# Patient Record
Sex: Male | Born: 1965 | Race: White | Hispanic: No | Marital: Married | Smoking: Never smoker
Health system: Southern US, Community
[De-identification: ages and names within clinical notes are randomized; demographics above are authoritative.]

## PROBLEM LIST (undated history)

## (undated) DIAGNOSIS — I10 Essential (primary) hypertension: Secondary | ICD-10-CM

## (undated) DIAGNOSIS — G8929 Other chronic pain: Secondary | ICD-10-CM

## (undated) DIAGNOSIS — J9383 Other pneumothorax: Secondary | ICD-10-CM

## (undated) DIAGNOSIS — M549 Dorsalgia, unspecified: Secondary | ICD-10-CM

## (undated) HISTORY — DX: Other pneumothorax: J93.83

## (undated) HISTORY — PX: WISDOM TOOTH EXTRACTION: SHX21

---

## 2007-04-24 ENCOUNTER — Ambulatory Visit: Payer: Self-pay | Admitting: Gastroenterology

## 2007-05-16 ENCOUNTER — Ambulatory Visit: Payer: Self-pay | Admitting: Gastroenterology

## 2007-06-11 DIAGNOSIS — K219 Gastro-esophageal reflux disease without esophagitis: Secondary | ICD-10-CM

## 2007-06-11 DIAGNOSIS — I10 Essential (primary) hypertension: Secondary | ICD-10-CM | POA: Insufficient documentation

## 2007-06-11 DIAGNOSIS — K644 Residual hemorrhoidal skin tags: Secondary | ICD-10-CM | POA: Insufficient documentation

## 2007-07-15 ENCOUNTER — Ambulatory Visit: Payer: Self-pay | Admitting: Gastroenterology

## 2007-07-15 LAB — CONVERTED CEMR LAB
Fecal Occult Blood: NEGATIVE
OCCULT 2: NEGATIVE
OCCULT 2: NEGATIVE
OCCULT 3: NEGATIVE
OCCULT 4: NEGATIVE
OCCULT 5: NEGATIVE

## 2009-08-19 ENCOUNTER — Encounter: Admission: RE | Admit: 2009-08-19 | Discharge: 2009-08-19 | Payer: Self-pay

## 2010-10-24 NOTE — Assessment & Plan Note (Signed)
[facility]                         GASTROENTEROLOGY OFFICE NOTE   NAME:Clermont, JOMO FORAND                        MRN:          [id number]  DATE:04/24/2007                            DOB:          09-24-1965    GASTROENTEROLOGY CONSULTATION   REASON FOR VISIT:  Painful hemorrhoids.  The patient is self referred.   HISTORY OF PRESENT ILLNESS:  Mr. Minturn is a very pleasant 45 year old  man who has had external hemorrhoids intermittently over the past three  to four years.  He had a pretty bad one about 2-1/2 weeks ago, got on  Sitz baths, Preparation H, was taking Colace and after about a week the  hemorrhoid resolved and then two days ago a new one came back.  He was  lifting some weights recently that may have contributed.  He has had no  bleeding.  He does intermittently have to strain to move his bowels.  He  describes himself as fairly irregularly irregular with his bowel habits.  Currently he is in quite a bit of discomfort, walks gingerly around the  room, sits gently on his bottom.   REVIEW OF SYSTEMS:  Essentially negative and is available on his nursing  intake sheet.   PAST MEDICAL HISTORY:  1. Hypertension.  2. Chronic GERD symptoms.   CURRENT MEDICATIONS:  Prilosec.   ALLERGIES:  No known drug allergies.   SOCIAL HISTORY:  Married with no children.  Currently not working.  I  believe he was recently laid off.  He is a nonsmoker, nondrinker.   FAMILY HISTORY:  Father with bladder cancer.  Mother and sister with  breast cancer.  Grandmother with uterine cancer.  Great aunt with colon  cancer.  Mother and grandmother with colon polyps.   PHYSICAL EXAMINATION:  VITAL SIGNS:  5 feet, 10 inches, 206 pounds.  Blood pressure 134/94. Pulse 82.  CONSTITUTIONAL:  Generally well-appearing.  NEUROLOGICAL:  Alert and oriented x3.  HEENT:  Eyes:  Extraocular movements intact. Mouth, oropharynx moist, no  lesions.  NECK:  Supple.  No  lymphadenopathy.  CARDIOVASCULAR:  Heart has regular rate and rhythm.  LUNGS:  Clear to auscultation bilaterally.  ABDOMEN:  Soft, nontender, nondistended.  Normal bowel sounds.  EXTREMITIES:  No lower extremity edema.  SKIN:  No rashes or lesions on visible extremities.  RECTAL:  Anorectal examination medium sized external hemorrhoid, swollen  and definitely tender.  There was no fluctuance or perianal erythema or  tenderness to suggest a perirectal abscess.  Cannot do a full rectal  examination due to pain but I did not feel any obvious distal rectal  masses.  No anal fissures.   ASSESSMENT:  This is a 45 year old man with painful external  hemorrhoids.   PLAN:  I have given him samples of several tubes of Analpram and  instructed him to apply this twice a day after he does Sitz baths.  I  have also recommended he begin taking Citrucel on a daily basis to try  to bulk up his stools and hopefully this will lead him to not have to  strain at  all.  He will return to see me in four to five weeks.  He does  have a family history of colon polyps and colon cancer that also runs in  his family and I think we should proceed with colonoscopy but I would  like to get this external hemorrhoid under control first.  If this does  not improve with conservative management I would refer him over to  general surgery.     Rachael Fee, MD  Electronically Signed    DPJ/MedQ  DD: 04/24/2007  DT: 04/25/2007  Job #: [id number]

## 2010-10-24 NOTE — Assessment & Plan Note (Signed)
[facility]                         GASTROENTEROLOGY OFFICE NOTE   NAME:Nunn, DRACO MALCZEWSKI                        MRN:          [id number]  DATE:05/16/2007                            DOB:          [birth year]    GI PROBLEM LIST:  External hemorrhoids.  Painful external hemorrhoids  that resolved with sitz baths, Analpram, Citrucel.   INTERVAL HISTORY:  I last saw Burgess about 1 month ago.  Since then, the  painful hemorrhoid has definitely resolved.  He said it took about a  week or 2 after seeing him in the office.  He has started to wean  himself off of the Citrucel.  He also brings up some chronic GERD  symptoms.  He has heartburn on an every 3 to 4 day basis.  This goes  back to when he was a teenager.  He has no dysphagia.  No vomiting.  He  takes Prilosec 3 to 4 times a week.   CURRENT MEDICATIONS:  Prilosec over-the-counter p.r.n.   ALLERGIES:  No known drug allergies.   PHYSICAL EXAM:  Weight 207 pounds, blood pressure 128/88, pulse 80.  CONSTITUTIONAL:  Generally well-appearing.  ABDOMEN:  Soft, nontender, nondistended.  Normal bowel sounds.  RECTAL:  Single small external hemorrhoid, nearly flattened out.  Much,  much improved since the exam 1 month ago.   ASSESSMENT AND PLAN:  A 45 year old male with chronic gastroesophageal  reflux disease symptoms and external hemorrhoids.   His GERD symptoms go back to when he was a teenager, and I think we  should arrange for him to have an EGD performed at his soonest  convenience to screen him for chronic complications, such as GERD.  He  does not have dysphagia and so I doubt he has any stricturing disease.  He has a significant family history of cancer, breast cancer, uterine  cancer, bladder cancer, in first degree relatives.  He is understandably  concerned.  I will send him home with some stool cards, and if any of  those are positive, then we will proceed with colonoscopy at the same  time as  his upper endoscopy.  I also recommended that he get back on the  Citrucel, as that is probably the best way to prevent him from having  recurrence of painful hemorrhoids again.     Rachael Fee, MD  Electronically Signed    DPJ/MedQ  DD: 05/16/2007  DT: 05/16/2007  Job #: [id number]

## 2011-04-18 ENCOUNTER — Emergency Department (INDEPENDENT_AMBULATORY_CARE_PROVIDER_SITE_OTHER): Payer: 59

## 2011-04-18 ENCOUNTER — Emergency Department (HOSPITAL_BASED_OUTPATIENT_CLINIC_OR_DEPARTMENT_OTHER)
Admission: EM | Admit: 2011-04-18 | Discharge: 2011-04-18 | Disposition: A | Discharge status: Home / Self Care | Payer: 59 | Attending: Emergency Medicine | Admitting: Emergency Medicine

## 2011-04-18 DIAGNOSIS — M549 Dorsalgia, unspecified: Secondary | ICD-10-CM

## 2011-04-18 DIAGNOSIS — S335XXA Sprain of ligaments of lumbar spine, initial encounter: Secondary | ICD-10-CM | POA: Insufficient documentation

## 2011-04-18 DIAGNOSIS — W19XXXA Unspecified fall, initial encounter: Secondary | ICD-10-CM | POA: Insufficient documentation

## 2011-04-18 DIAGNOSIS — G8929 Other chronic pain: Secondary | ICD-10-CM | POA: Insufficient documentation

## 2011-04-18 DIAGNOSIS — S39012A Strain of muscle, fascia and tendon of lower back, initial encounter: Secondary | ICD-10-CM

## 2011-04-18 HISTORY — DX: Dorsalgia, unspecified: M54.9

## 2011-04-18 HISTORY — DX: Essential (primary) hypertension: I10

## 2011-04-18 HISTORY — DX: Other chronic pain: G89.29

## 2011-04-18 MED ORDER — CYCLOBENZAPRINE HCL 10 MG PO TABS: 10.0000 mg | ORAL_TABLET | Freq: Two times a day (BID) | ORAL | Status: DC | PRN

## 2011-04-18 MED ORDER — KETOROLAC TROMETHAMINE 30 MG/ML IJ SOLN
30.0000 mg | Freq: Once | INTRAMUSCULAR | Status: AC
  Administered 2011-04-18: 30 mg via INTRAVENOUS
  Filled 2011-04-18: qty 1

## 2011-04-18 MED ORDER — OXYCODONE-ACETAMINOPHEN 5-325 MG PO TABS: 2.0000 | ORAL_TABLET | ORAL | Status: DC | PRN

## 2011-04-18 MED ORDER — OXYCODONE-ACETAMINOPHEN 5-325 MG PO TABS
1.0000 | ORAL_TABLET | Freq: Once | ORAL | Status: AC
  Administered 2011-04-18: 1 via ORAL
  Filled 2011-04-18: qty 1

## 2011-04-18 NOTE — ED Notes (Signed)
Chronic back pain-worse last night-was seen by chiropractor with adjustment PTA-pain worse after adjustment

## 2011-04-18 NOTE — ED Provider Notes (Signed)
History     CSN: [id number] Arrival date & time: 04/18/2011  5:09 PM   First MD Initiated Contact with Patient 04/18/11 1710      Chief Complaint  Patient presents with  . Back Pain   Patient does have a history of chronic back pain.  He states the pain is worse over the past few days. Patient states that he has the pain "every 4 years.". Patient saw a chiropractor today and then referred him to a family practice doctor. He was not actually seen by the family practice doctor, but was sent here for further care. Patient is requesting an x-ray, to "rule out any fracture." Patient was told his pain was likely secondary to degenerative disc disease or bulging disc. He's had no numbness, weakness or tingling. No urinary incontinence, or saddle anesthesia. He did fall approximately 1 month ago. He states he normally takes anti-inflammatories for pain. The pain is diffusely along the lower back. Worse with certain positions and movement. No fever or dysuria (Consider location/radiation/quality/duration/timing/severity/associated sxs/prior treatment) HPI  Past Medical History  Diagnosis Date  . Back pain, chronic   . Hypertension     History reviewed. No pertinent past surgical history.  No family history on file.  History  Substance Use Topics  . Smoking status: Never Smoker   . Smokeless tobacco: Not on file  . Alcohol Use: No      Review of Systems  All other systems reviewed and are negative.    Allergies  Review of patient's allergies indicates no known allergies.  Home Medications  No current outpatient prescriptions on file.  BP 125/99  Pulse 86  Temp(Src) 98.2 F (36.8 C) (Oral)  Resp 16  Ht 5\' 10"  (1.778 m)  Wt 205 lb (92.987 kg)  BMI 29.41 kg/m2  SpO2 100%  Physical Exam  Constitutional: He is oriented to person, place, and time. He appears well-developed and well-nourished.  HENT:  Head: Normocephalic and atraumatic.  Eyes: Conjunctivae and EOM are  normal. Pupils are equal, round, and reactive to light.  Neck: Neck supple.  Cardiovascular: Normal rate and regular rhythm.  Exam reveals no gallop and no friction rub.   No murmur heard. Pulmonary/Chest: Breath sounds normal. He has no wheezes. He has no rales. He exhibits no tenderness.  Abdominal: Soft. Bowel sounds are normal. He exhibits no distension. There is no tenderness. There is no rebound and no guarding.  Musculoskeletal: Normal range of motion.       Diffuse lumbar muscular tenderness. Minimal lumbar spine tenderness. No swelling  Neurological: He is alert and oriented to person, place, and time. He has normal reflexes. No cranial nerve deficit. Coordination normal.  Skin: Skin is warm and dry. No rash noted.  Psychiatric: He has a normal mood and affect.    ED Course  Procedures (including critical care time)  Labs Reviewed - No data to display No results found.   No diagnosis found.    MDM  Patient is seen and examined, initial history and physical is completed. Evaluation initiated        Loron Weimer A. Patrica Duel, MD 04/18/11 1724

## 2011-04-19 ENCOUNTER — Ambulatory Visit: Payer: 59 | Admitting: Family Medicine

## 2011-04-20 ENCOUNTER — Ambulatory Visit (INDEPENDENT_AMBULATORY_CARE_PROVIDER_SITE_OTHER): Payer: 59 | Admitting: Family Medicine

## 2011-04-20 ENCOUNTER — Encounter: Payer: Self-pay | Admitting: Family Medicine

## 2011-04-20 VITALS — BP 145/98 | HR 88 | Temp 98.5°F | Ht 70.0 in | Wt 205.0 lb

## 2011-04-20 DIAGNOSIS — M545 Low back pain, unspecified: Secondary | ICD-10-CM | POA: Insufficient documentation

## 2011-04-20 DIAGNOSIS — M549 Dorsalgia, unspecified: Secondary | ICD-10-CM

## 2011-04-20 MED ORDER — CYCLOBENZAPRINE HCL 10 MG PO TABS: 10.0000 mg | ORAL_TABLET | Freq: Three times a day (TID) | ORAL | Status: AC | PRN

## 2011-04-20 MED ORDER — OXYCODONE-ACETAMINOPHEN 5-325 MG PO TABS: 1.0000 | ORAL_TABLET | Freq: Four times a day (QID) | ORAL | Status: AC | PRN

## 2011-04-20 NOTE — Assessment & Plan Note (Signed)
Acute on chronic low back pain - no evidence lumbar radiculopathy.  Has never tried PT for this - will start now.  He is also interested in seeing a chiropractor for this.  Start aleve 2 tabs twice a day with percocet and flexeril as needed.  Heat or ice as needed.  Is considering a lumbar support.  F/u in 1 month for reevaluation.
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
## 2011-04-20 NOTE — Patient Instructions (Signed)
This is likely a lumbar strain and spasms vs a bulging disc in your back. Both are treated the same initially so it's not urgent to get an MRI to assess this unless you're not getting better as expected over the next month. Aleve 2 tablets twice a day with food (or ibuprofen 3 tablets three times a day with food) for pain and inflammation (if you do not have stomach or kidney issues). Percocet as needed for severe pain (no driving on this medicine). Flexeril as needed for muscle spasms (no driving on this medicine if it makes you sleepy). Stay as active as possible. Buy back/lumbar support if this helps you with pain but don't become solely reliant on this. Start physical therapy for up to 6 weeks though you can transition to a home program sooner than this. Do home exercises and stretches as directed - hold each for 20-30 seconds and do each one three times. Consider massage, chiropractor, physical therapy, and/or acupuncture. Physical therapy has been shown to be helpful while the others have mixed results. Thereasa Distance is the chiropractor I typically recommend [phone]) - in [place] on [address]. Strengthening of low back muscles, abdominal musculature are key for long term pain relief. If not improving, will consider further imaging (MRI). Follow up with me in 1 month for a recheck on how you're doing.

## 2011-04-23 ENCOUNTER — Ambulatory Visit: Payer: 59 | Attending: Family Medicine | Admitting: Physical Therapy

## 2011-04-23 DIAGNOSIS — IMO0001 Reserved for inherently not codable concepts without codable children: Secondary | ICD-10-CM | POA: Insufficient documentation

## 2011-04-23 DIAGNOSIS — M545 Low back pain, unspecified: Secondary | ICD-10-CM | POA: Insufficient documentation

## 2011-04-30 ENCOUNTER — Ambulatory Visit: Payer: 59 | Admitting: Physical Therapy

## 2011-05-07 ENCOUNTER — Ambulatory Visit: Payer: 59 | Admitting: Physical Therapy

## 2011-05-14 ENCOUNTER — Ambulatory Visit: Payer: 59 | Attending: Family Medicine | Admitting: Physical Therapy

## 2011-05-14 DIAGNOSIS — IMO0001 Reserved for inherently not codable concepts without codable children: Secondary | ICD-10-CM | POA: Insufficient documentation

## 2011-05-14 DIAGNOSIS — M545 Low back pain, unspecified: Secondary | ICD-10-CM | POA: Insufficient documentation

## 2011-05-21 ENCOUNTER — Ambulatory Visit: Payer: 59 | Admitting: Physical Therapy

## 2011-05-21 ENCOUNTER — Ambulatory Visit: Payer: 59 | Admitting: Family Medicine

## 2011-05-28 ENCOUNTER — Ambulatory Visit: Payer: 59 | Admitting: Physical Therapy

## 2011-05-28 ENCOUNTER — Encounter: Payer: Self-pay | Admitting: Family Medicine

## 2011-05-28 ENCOUNTER — Ambulatory Visit (INDEPENDENT_AMBULATORY_CARE_PROVIDER_SITE_OTHER): Payer: 59 | Admitting: Family Medicine

## 2011-05-28 VITALS — BP 138/93 | HR 89 | Temp 98.3°F | Ht 70.0 in | Wt 205.0 lb

## 2011-05-28 DIAGNOSIS — M545 Low back pain: Secondary | ICD-10-CM

## 2011-05-28 NOTE — Assessment & Plan Note (Signed)
Acute on chronic low back pain - no evidence lumbar radiculopathy.  Did well with PT until Saturday when did a lot of bending and lifting with christmas decorations.  Encouraged to continue with therapy and current medicines.  Again given number for chiropractor as he wants to try this also.  Heat/ice as needed.  Has lumbar support at home.  Using a TENS unit as well.  F/u in 5-6 weeks for reevaluation.

## 2011-05-28 NOTE — Patient Instructions (Signed)
Aleve 2 tablets twice a day with food (or ibuprofen 3 tablets three times a day with food) for pain and inflammation (if you do not have stomach or kidney issues). Percocet as needed for severe pain (no driving on this medicine). Flexeril as needed for muscle spasms (no driving on this medicine if it makes you sleepy). Stay as active as possible. If you can't find your lumbar support, let us know and we can fit you with one here - use it when you'll be doing a lot of lifting, crawling, bending. Continue with physical therapy and home stretches/exercises. Consider massage, chiropractor, physical therapy, and/or acupuncture. Physical therapy has been shown to be helpful while the others have mixed results. Thereasa Distance is the chiropractor I typically recommend [phone]) - in [place] on [address]. Strengthening of low back muscles, abdominal musculature are key for long term pain relief. Follow up with me in 5-6 weeks for a recheck. Prednisone is a consideration but has a lot of side effects so we do not use this a lot and is more helpful in those with an obvious pinched nerve in their back.
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
## 2011-05-30 ENCOUNTER — Ambulatory Visit: Payer: 59 | Admitting: Physical Therapy

## 2011-05-30 ENCOUNTER — Ambulatory Visit: Payer: 59 | Admitting: Family Medicine

## 2011-06-01 NOTE — Progress Notes (Signed)
Addended by: Lenda Kelp on: 06/01/2011 03:33 PM   Modules accepted: Orders

## 2011-06-02 ENCOUNTER — Ambulatory Visit (HOSPITAL_BASED_OUTPATIENT_CLINIC_OR_DEPARTMENT_OTHER): Admission: RE | Admit: 2011-06-02 | Payer: 59 | Source: Ambulatory Visit

## 2011-06-02 ENCOUNTER — Ambulatory Visit (HOSPITAL_BASED_OUTPATIENT_CLINIC_OR_DEPARTMENT_OTHER)
Admission: RE | Admit: 2011-06-02 | Discharge: 2011-06-02 | Disposition: A | Discharge status: Home / Self Care | Payer: 59 | Source: Ambulatory Visit | Attending: Family Medicine | Admitting: Family Medicine

## 2011-06-02 DIAGNOSIS — M545 Low back pain, unspecified: Secondary | ICD-10-CM

## 2011-06-09 ENCOUNTER — Ambulatory Visit (HOSPITAL_BASED_OUTPATIENT_CLINIC_OR_DEPARTMENT_OTHER)
Admission: RE | Admit: 2011-06-09 | Discharge: 2011-06-09 | Disposition: A | Discharge status: Home / Self Care | Payer: 59 | Source: Ambulatory Visit | Attending: Family Medicine | Admitting: Family Medicine

## 2011-06-09 DIAGNOSIS — M5126 Other intervertebral disc displacement, lumbar region: Secondary | ICD-10-CM | POA: Insufficient documentation

## 2011-06-09 DIAGNOSIS — M519 Unspecified thoracic, thoracolumbar and lumbosacral intervertebral disc disorder: Secondary | ICD-10-CM | POA: Insufficient documentation

## 2011-06-09 DIAGNOSIS — M545 Low back pain, unspecified: Secondary | ICD-10-CM | POA: Insufficient documentation

## 2011-06-09 DIAGNOSIS — M5137 Other intervertebral disc degeneration, lumbosacral region: Secondary | ICD-10-CM

## 2011-06-09 DIAGNOSIS — M79609 Pain in unspecified limb: Secondary | ICD-10-CM

## 2011-06-09 DIAGNOSIS — M6281 Muscle weakness (generalized): Secondary | ICD-10-CM | POA: Insufficient documentation

## 2011-06-09 DIAGNOSIS — R5381 Other malaise: Secondary | ICD-10-CM

## 2011-06-20 ENCOUNTER — Ambulatory Visit: Payer: 59 | Attending: Family Medicine | Admitting: Physical Therapy

## 2011-06-20 DIAGNOSIS — M545 Low back pain, unspecified: Secondary | ICD-10-CM | POA: Insufficient documentation

## 2011-06-20 DIAGNOSIS — IMO0001 Reserved for inherently not codable concepts without codable children: Secondary | ICD-10-CM | POA: Insufficient documentation

## 2011-06-27 ENCOUNTER — Ambulatory Visit: Payer: 59 | Admitting: Physical Therapy

## 2011-07-04 ENCOUNTER — Ambulatory Visit: Payer: 59 | Admitting: Physical Therapy

## 2011-07-11 ENCOUNTER — Ambulatory Visit: Payer: 59 | Admitting: Physical Therapy

## 2011-07-18 ENCOUNTER — Ambulatory Visit: Payer: 59 | Attending: Internal Medicine | Admitting: Physical Therapy

## 2011-07-18 DIAGNOSIS — IMO0001 Reserved for inherently not codable concepts without codable children: Secondary | ICD-10-CM | POA: Insufficient documentation

## 2011-07-18 DIAGNOSIS — M545 Low back pain, unspecified: Secondary | ICD-10-CM | POA: Insufficient documentation

## 2011-07-25 ENCOUNTER — Ambulatory Visit: Payer: 59 | Admitting: Physical Therapy

## 2011-09-07 ENCOUNTER — Encounter: Payer: Self-pay | Admitting: *Deleted

## 2012-01-15 IMAGING — CR DG LUMBAR SPINE 2-3V
3 series · 3 of 3 positions shown · non-contrast
Comparison: 04/18/2011

CLINICAL DATA: Low back pain.

LUMBAR SPINE - 2-3 VIEW

[t l-spine a.p.]
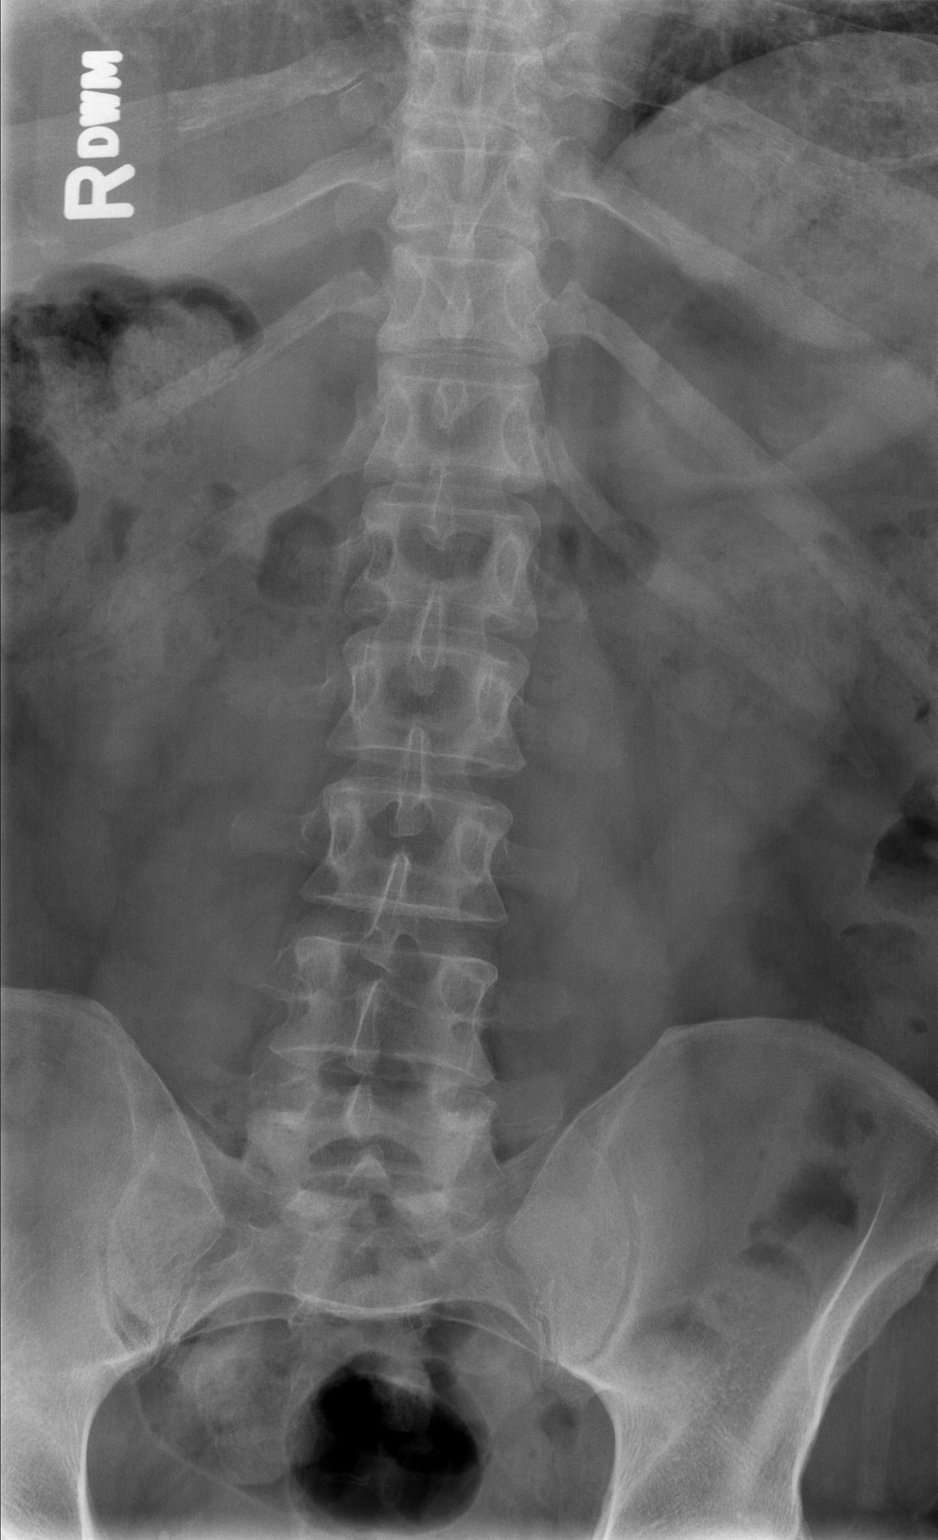

[t l-spine lat]
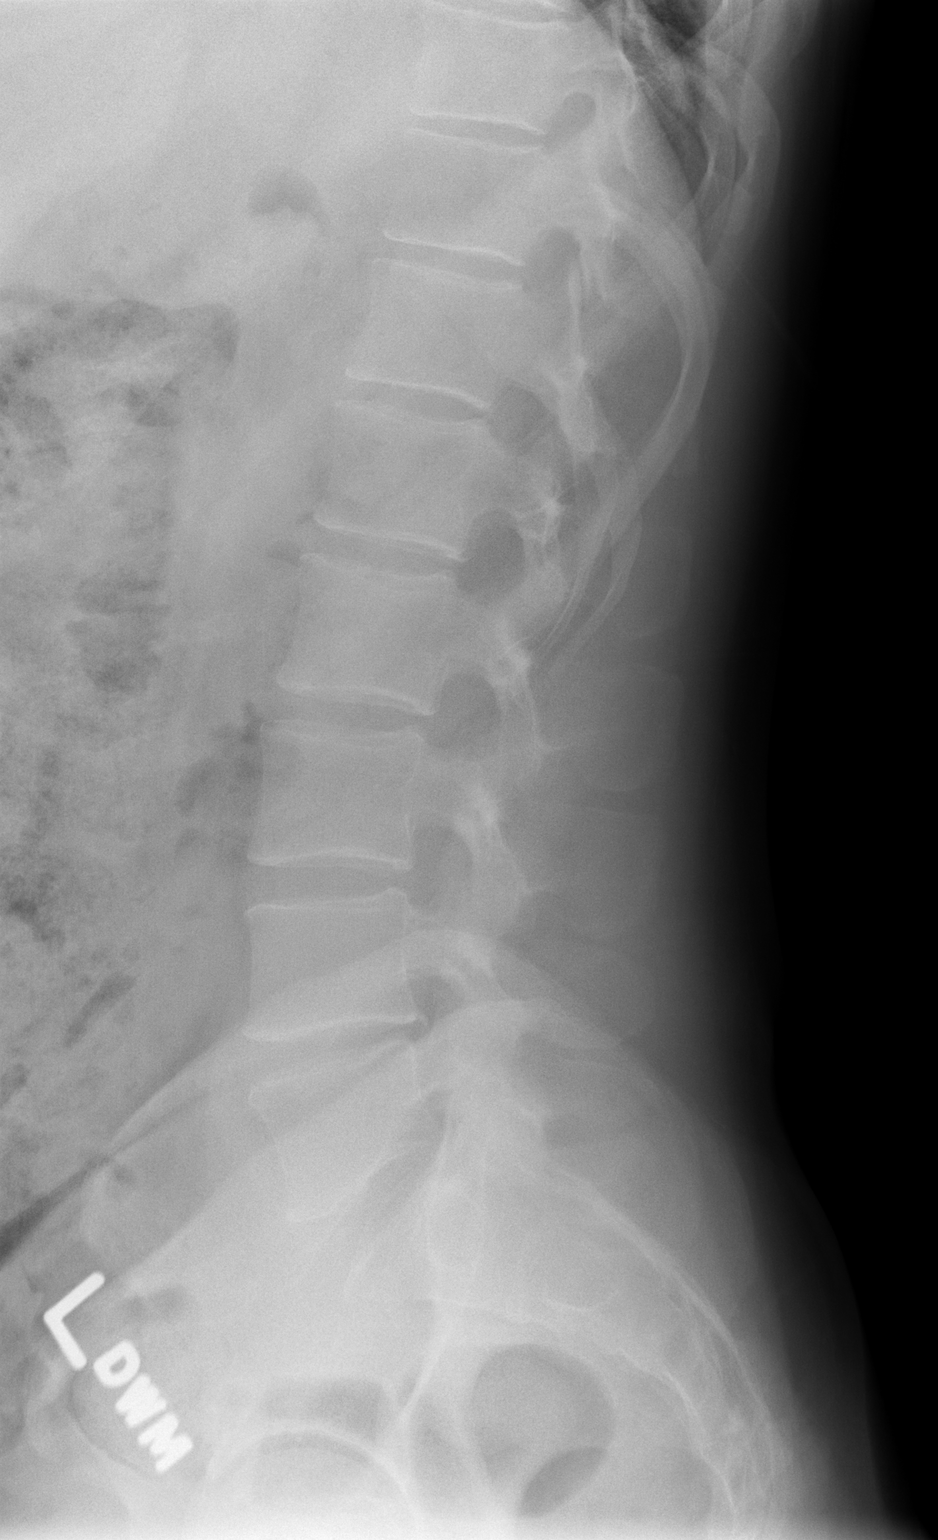

[t l-spine l5-s1 spot]
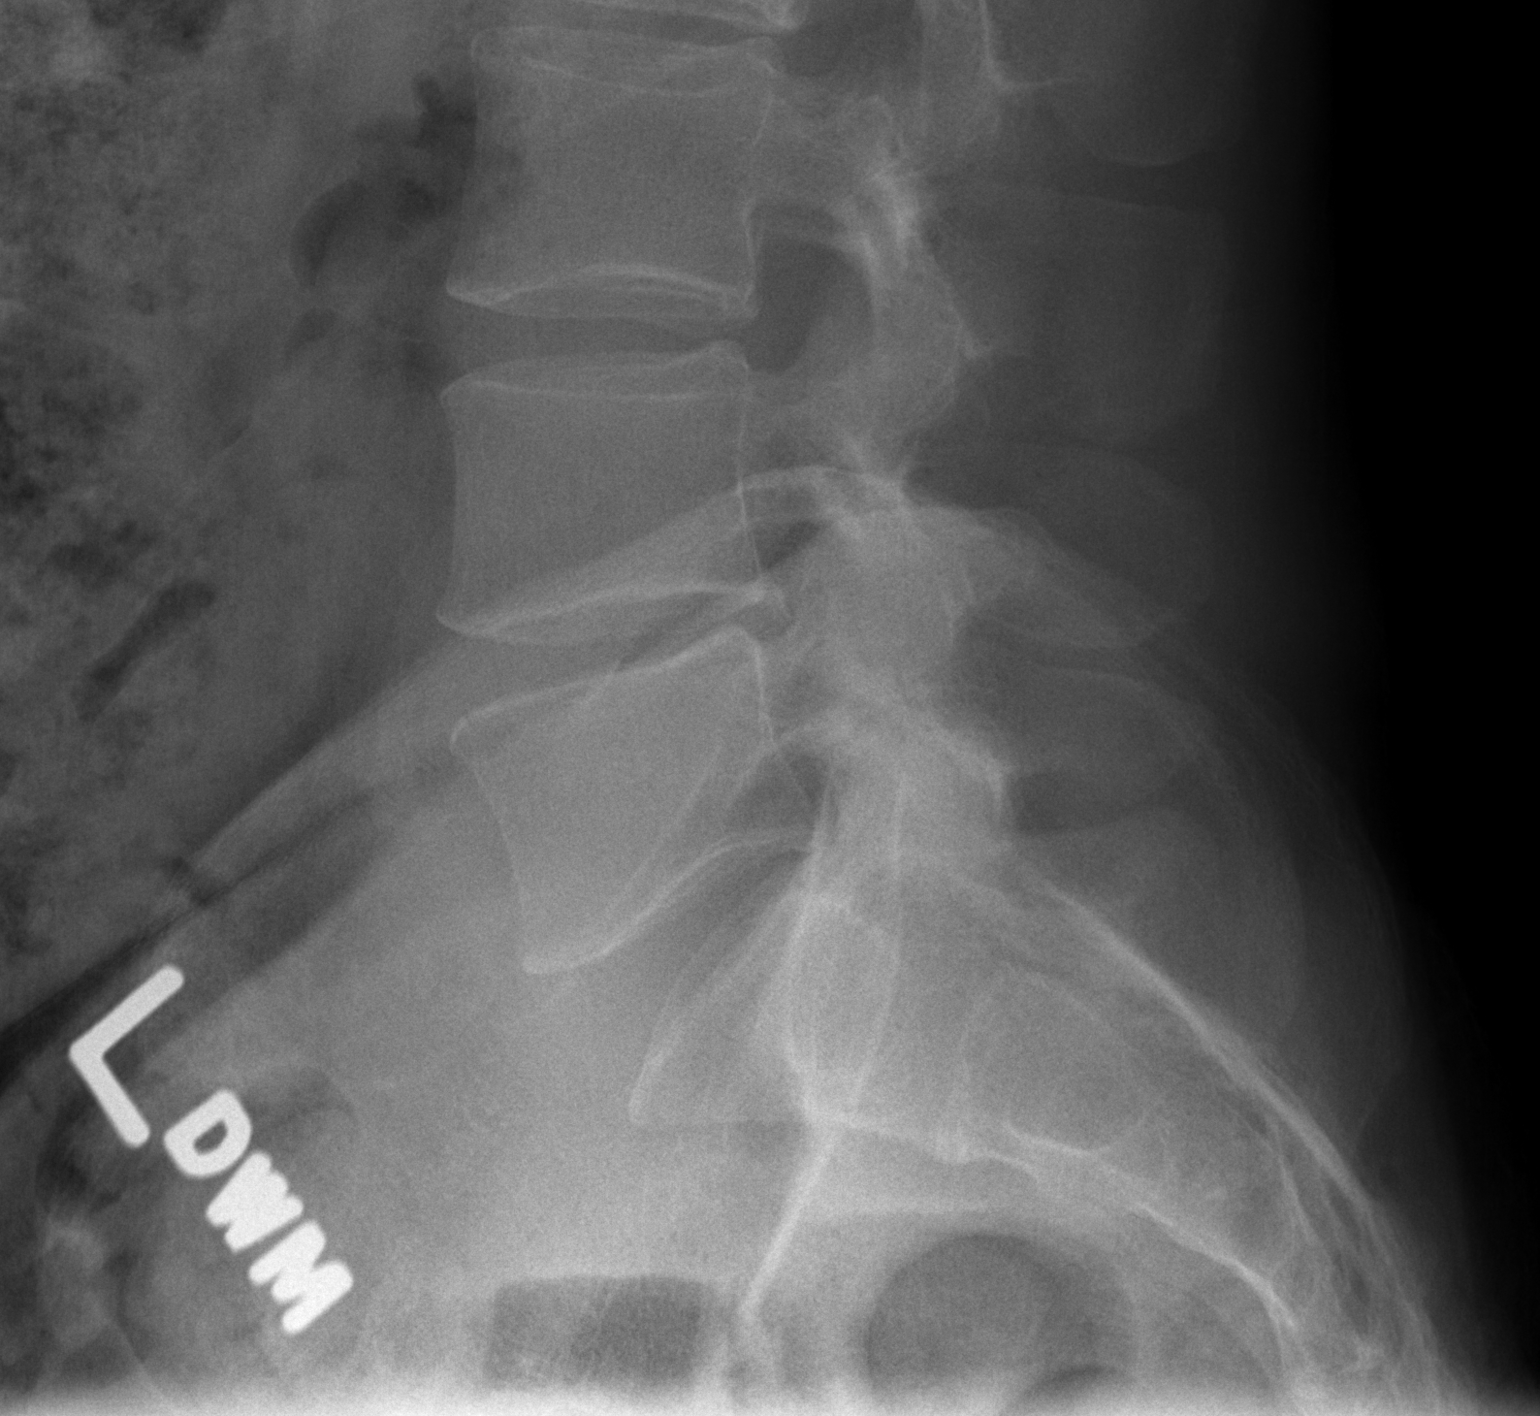

[3 of 3 positions shown; findings below may reference images not displayed]

FINDINGS: There are five lumbar-type vertebral bodies.  No fracture
or malalignment.  Disc spaces well maintained.  SI joints are
symmetric.
IMPRESSION: No acute findings.  No change.

## 2012-10-15 ENCOUNTER — Emergency Department (HOSPITAL_BASED_OUTPATIENT_CLINIC_OR_DEPARTMENT_OTHER): Payer: 59

## 2012-10-15 ENCOUNTER — Emergency Department (HOSPITAL_BASED_OUTPATIENT_CLINIC_OR_DEPARTMENT_OTHER)
Admission: EM | Admit: 2012-10-15 | Discharge: 2012-10-15 | Disposition: A | Discharge status: Home / Self Care | Payer: 59 | Attending: Emergency Medicine | Admitting: Emergency Medicine

## 2012-10-15 ENCOUNTER — Encounter (HOSPITAL_BASED_OUTPATIENT_CLINIC_OR_DEPARTMENT_OTHER): Payer: Self-pay | Admitting: *Deleted

## 2012-10-15 DIAGNOSIS — K5732 Diverticulitis of large intestine without perforation or abscess without bleeding: Secondary | ICD-10-CM | POA: Insufficient documentation

## 2012-10-15 DIAGNOSIS — Z8709 Personal history of other diseases of the respiratory system: Secondary | ICD-10-CM | POA: Insufficient documentation

## 2012-10-15 DIAGNOSIS — Z79899 Other long term (current) drug therapy: Secondary | ICD-10-CM | POA: Insufficient documentation

## 2012-10-15 DIAGNOSIS — G8929 Other chronic pain: Secondary | ICD-10-CM | POA: Insufficient documentation

## 2012-10-15 DIAGNOSIS — K5792 Diverticulitis of intestine, part unspecified, without perforation or abscess without bleeding: Secondary | ICD-10-CM

## 2012-10-15 DIAGNOSIS — I10 Essential (primary) hypertension: Secondary | ICD-10-CM | POA: Insufficient documentation

## 2012-10-15 LAB — CBC WITH DIFFERENTIAL/PLATELET
Basophils Absolute: 0 10*3/uL (ref 0.0–0.1)
Basophils Relative: 0 % (ref 0–1)
Eosinophils Absolute: 0.3 10*3/uL (ref 0.0–0.7)
Eosinophils Relative: 3 % (ref 0–5)
Hemoglobin: 15.4 g/dL (ref 13.0–17.0)
Lymphs Abs: 2.6 10*3/uL (ref 0.7–4.0)
MCHC: 36.3 g/dL — ABNORMAL HIGH (ref 30.0–36.0)
MCV: 89.8 fL (ref 78.0–100.0)
Monocytes Absolute: 0.9 10*3/uL (ref 0.1–1.0)
Neutro Abs: 6.5 10*3/uL (ref 1.7–7.7)
RDW: 12.3 % (ref 11.5–15.5)

## 2012-10-15 LAB — BASIC METABOLIC PANEL
Creatinine, Ser: 1 mg/dL (ref 0.50–1.35)
GFR calc Af Amer: >90 mL/min (ref >90)
GFR calc non Af Amer: 90 mL/min — ABNORMAL LOW (ref >90)

## 2012-10-15 LAB — URINALYSIS, ROUTINE W REFLEX MICROSCOPIC
Hgb urine dipstick: NEGATIVE
Ketones, ur: NEGATIVE mg/dL
Leukocytes, UA: NEGATIVE
Specific Gravity, Urine: 1.016 (ref 1.005–1.030)

## 2012-10-15 MED ORDER — IOHEXOL 300 MG/ML  SOLN
100.0000 mL | Freq: Once | INTRAMUSCULAR | Status: AC | PRN
  Administered 2012-10-15: 100 mL via INTRAVENOUS

## 2012-10-15 MED ORDER — SODIUM CHLORIDE 0.9 % IV BOLUS (SEPSIS)
1000.0000 mL | Freq: Once | INTRAVENOUS | Status: AC
  Administered 2012-10-15: 1000 mL via INTRAVENOUS

## 2012-10-15 MED ORDER — CIPROFLOXACIN HCL 500 MG PO TABS: 500.0000 mg | ORAL_TABLET | Freq: Two times a day (BID) | ORAL | Status: DC

## 2012-10-15 MED ORDER — HYDROCODONE-ACETAMINOPHEN 5-325 MG PO TABS: 2.0000 | ORAL_TABLET | ORAL | Status: DC | PRN

## 2012-10-15 MED ORDER — METRONIDAZOLE 500 MG PO TABS: 500.0000 mg | ORAL_TABLET | Freq: Three times a day (TID) | ORAL | Status: DC

## 2012-10-15 MED ORDER — IOHEXOL 300 MG/ML  SOLN
50.0000 mL | Freq: Once | INTRAMUSCULAR | Status: AC | PRN
  Administered 2012-10-15: 50 mL via ORAL

## 2012-10-15 NOTE — ED Notes (Signed)
Patient transported to CT 

## 2012-10-15 NOTE — ED Notes (Signed)
Pt reports lower left abd pain denies n/v/d

## 2012-10-15 NOTE — ED Provider Notes (Signed)
History     CSN: [id number]  Arrival date & time 10/15/12  1806   First MD Initiated Contact with Patient 10/15/12 1819      Chief Complaint  Patient presents with  . Abdominal Pain    (Consider location/radiation/quality/duration/timing/severity/associated sxs/prior treatment) HPI Comments: Patient presents with complaints of pain in the LLQ of the abdomen since this morning and has gradually worsened throughout the day.  No v/d.  No urinary complaints.  Never had this before.  Patient is a 47 y.o. male presenting with abdominal pain. The history is provided by the patient.  Abdominal Pain Pain location:  LLQ Pain quality: sharp   Pain radiates to:  Does not radiate Pain severity:  Moderate Onset quality:  Gradual Duration:  12 hours Timing:  Constant Progression:  Worsening Chronicity:  New Context: not diet changes and not previous surgeries   Relieved by:  Nothing Worsened by:  Movement, palpation and position changes Ineffective treatments:  None tried Associated symptoms: no chills, no fever, no nausea and no vomiting     Past Medical History  Diagnosis Date  . Back pain, chronic   . Hypertension   . Spontaneous pneumothorax     as a child  . Chronic heartburn     Past Surgical History  Procedure Laterality Date  . Wisdom tooth extraction      Family History  Problem Relation Age of Onset  . Hypertension Father   . Diabetes Maternal Aunt   . Diabetes Maternal Grandmother   . Sudden death Paternal Grandfather   . Hyperlipidemia Neg Hx   . Heart attack Neg Hx   . Cancer Mother     breast  . Mitral valve prolapse Mother   . Cancer Sister     breast    History  Substance Use Topics  . Smoking status: Never Smoker   . Smokeless tobacco: Not on file  . Alcohol Use: No      Review of Systems  Constitutional: Negative for fever and chills.  Gastrointestinal: Positive for abdominal pain. Negative for nausea and vomiting.  All other systems  reviewed and are negative.    Allergies  Bee venom  Home Medications   Current Outpatient Rx  Name  Route  Sig  Dispense  Refill  . calcium carbonate (TUMS EX) 750 MG chewable tablet   Oral   Chew 2 tablets by mouth daily as needed. For indigestion          . cyclobenzaprine (FLEXERIL) 10 MG tablet               . ibuprofen (ADVIL,MOTRIN) 200 MG tablet   Oral   Take 600 mg by mouth every 6 (six) hours as needed. For pain          . oxyCODONE-acetaminophen (PERCOCET) 5-325 MG per tablet                 Pulse 88  Temp(Src) 99.1 F (37.3 C)  Resp 16  Ht 5\' 10"  (1.778 m)  Wt 205 lb (92.987 kg)  BMI 29.41 kg/m2  SpO2 100%  Physical Exam  Vitals reviewed. Constitutional: He is oriented to person, place, and time. He appears well-developed and well-nourished. No distress.  HENT:  Head: Normocephalic and atraumatic.  Mouth/Throat: Oropharynx is clear and moist.  Neck: Normal range of motion. Neck supple.  Cardiovascular: Normal rate and regular rhythm.   No murmur heard. Pulmonary/Chest: Effort normal and breath sounds normal. No respiratory distress. He has  no wheezes. He has no rales.  Abdominal: Soft. Bowel sounds are normal. He exhibits no distension.  There is ttp in the LLQ without rebound or guarding.    Musculoskeletal: Normal range of motion. He exhibits no edema.  Neurological: He is alert and oriented to person, place, and time.  Skin: Skin is warm and dry. He is not diaphoretic.    ED Course  Procedures (including critical care time)  Labs Reviewed  URINALYSIS, ROUTINE W REFLEX MICROSCOPIC  CBC WITH DIFFERENTIAL  BASIC METABOLIC PANEL   No results found.   No diagnosis found.    MDM  The patient presents with LLQ pain since earlier this morning.  He has no fever or wbc, but ct scan reveals an area within the descending colon that is likely to represent diverticulitis or colitis.  I will treat with cipro / flagyl, pain meds.  To follow  up with pcp to discuss colonoscopy.  The radiology report mentions that the area in question could possibly represent a mass.  Patient informed of this possibility.  To discuss with pcp.          Geoffery Lyons, MD 10/15/12 2015

## 2015-02-12 ENCOUNTER — Emergency Department (HOSPITAL_BASED_OUTPATIENT_CLINIC_OR_DEPARTMENT_OTHER)
Admission: EM | Admit: 2015-02-12 | Discharge: 2015-02-12 | Disposition: A | Discharge status: Home / Self Care | Payer: 59 | Attending: Emergency Medicine | Admitting: Emergency Medicine

## 2015-02-12 ENCOUNTER — Encounter (HOSPITAL_BASED_OUTPATIENT_CLINIC_OR_DEPARTMENT_OTHER): Payer: Self-pay

## 2015-02-12 ENCOUNTER — Emergency Department (HOSPITAL_BASED_OUTPATIENT_CLINIC_OR_DEPARTMENT_OTHER): Payer: 59

## 2015-02-12 DIAGNOSIS — Z8709 Personal history of other diseases of the respiratory system: Secondary | ICD-10-CM | POA: Diagnosis not present

## 2015-02-12 DIAGNOSIS — R0602 Shortness of breath: Secondary | ICD-10-CM | POA: Insufficient documentation

## 2015-02-12 DIAGNOSIS — N200 Calculus of kidney: Secondary | ICD-10-CM | POA: Insufficient documentation

## 2015-02-12 DIAGNOSIS — M549 Dorsalgia, unspecified: Secondary | ICD-10-CM | POA: Diagnosis present

## 2015-02-12 DIAGNOSIS — R109 Unspecified abdominal pain: Secondary | ICD-10-CM

## 2015-02-12 DIAGNOSIS — I10 Essential (primary) hypertension: Secondary | ICD-10-CM | POA: Insufficient documentation

## 2015-02-12 DIAGNOSIS — G8929 Other chronic pain: Secondary | ICD-10-CM | POA: Insufficient documentation

## 2015-02-12 DIAGNOSIS — R000 Tachycardia, unspecified: Secondary | ICD-10-CM | POA: Insufficient documentation

## 2015-02-12 LAB — URINALYSIS, ROUTINE W REFLEX MICROSCOPIC
Bilirubin Urine: NEGATIVE
GLUCOSE, UA: NEGATIVE mg/dL
KETONES UR: NEGATIVE mg/dL
LEUKOCYTES UA: NEGATIVE
NITRITE: NEGATIVE
PH: 5.5 (ref 5.0–8.0)
PROTEIN: NEGATIVE mg/dL
Specific Gravity, Urine: 1.024 (ref 1.005–1.030)
Urobilinogen, UA: 2 mg/dL — ABNORMAL HIGH (ref 0.0–1.0)

## 2015-02-12 LAB — BASIC METABOLIC PANEL
ANION GAP: 8 (ref 5–15)
BUN: 14 mg/dL (ref 6–20)
CALCIUM: 9.5 mg/dL (ref 8.9–10.3)
CO2: 27 mmol/L (ref 22–32)
Chloride: 105 mmol/L (ref 101–111)
Creatinine, Ser: 1.12 mg/dL (ref 0.61–1.24)
GLUCOSE: 114 mg/dL — AB (ref 65–99)
POTASSIUM: 3.5 mmol/L (ref 3.5–5.1)
Sodium: 140 mmol/L (ref 135–145)

## 2015-02-12 LAB — CBC
HEMATOCRIT: 42.8 % (ref 39.0–52.0)
HEMOGLOBIN: 15 g/dL (ref 13.0–17.0)
MCH: 32 pg (ref 26.0–34.0)
MCHC: 35 g/dL (ref 30.0–36.0)
MCV: 91.3 fL (ref 78.0–100.0)
Platelets: 227 10*3/uL (ref 150–400)
RBC: 4.69 MIL/uL (ref 4.22–5.81)
RDW: 12.3 % (ref 11.5–15.5)
WBC: 7.9 10*3/uL (ref 4.0–10.5)

## 2015-02-12 LAB — URINE MICROSCOPIC-ADD ON

## 2015-02-12 MED ORDER — PROMETHAZINE HCL 25 MG PO TABS: 25.0000 mg | ORAL_TABLET | Freq: Four times a day (QID) | ORAL | Status: DC | PRN

## 2015-02-12 MED ORDER — KETOROLAC TROMETHAMINE 30 MG/ML IJ SOLN
30.0000 mg | Freq: Once | INTRAMUSCULAR | Status: AC
  Administered 2015-02-12: 30 mg via INTRAVENOUS
  Filled 2015-02-12: qty 1

## 2015-02-12 MED ORDER — OXYCODONE-ACETAMINOPHEN 5-325 MG PO TABS: 1.0000 | ORAL_TABLET | ORAL | Status: DC | PRN

## 2015-02-12 MED ORDER — TAMSULOSIN HCL 0.4 MG PO CAPS: 0.4000 mg | ORAL_CAPSULE | Freq: Two times a day (BID) | ORAL | Status: DC

## 2015-02-12 MED ORDER — HYDROMORPHONE HCL 1 MG/ML IJ SOLN
1.0000 mg | Freq: Once | INTRAMUSCULAR | Status: AC
  Administered 2015-02-12: 1 mg via INTRAVENOUS
  Filled 2015-02-12: qty 1

## 2015-02-12 MED ORDER — IBUPROFEN 800 MG PO TABS: 800.0000 mg | ORAL_TABLET | Freq: Three times a day (TID) | ORAL | Status: DC

## 2015-02-12 NOTE — Discharge Instructions (Signed)

## 2015-02-12 NOTE — ED Notes (Signed)
Pt reports right lower back pain that began around 1400 and returned today. Original onset Monday, Urine became dark 2 days ago.

## 2015-02-12 NOTE — ED Provider Notes (Signed)
CSN: [id number]     Arrival date & time 02/12/15  2034 History  This chart was scribed for Eber Hong, MD by Doreatha Martin, ED Scribe. This patient was seen in room MH03/MH03 and the patient's care was started at 9:04 PM.     Chief Complaint  Patient presents with  . Back Pain   The history is provided by the patient. No language interpreter was used.    HPI Comments: Johnathan White is a 49 y.o. male with Hx of HTN, chronic back pain who presents to the Emergency Department complaining of moderate, sharp, intermittent right lower back pain that radiates to the abdomen onset 5 days ago worsened today while outside in the yard. He states associated stinging penile pain, SOB secondary to the pain, difficulty ambulating secondary to the pain, dark urine. Pt states that he has been drinking fluids regularly and reports 40 oz today. NKDA. He denies fever, chills, pain with eating, hematuria.   Past Medical History  Diagnosis Date  . Back pain, chronic   . Hypertension   . Spontaneous pneumothorax     as a child  . Chronic heartburn    Past Surgical History  Procedure Laterality Date  . Wisdom tooth extraction     Family History  Problem Relation Age of Onset  . Hypertension Father   . Diabetes Maternal Aunt   . Diabetes Maternal Grandmother   . Sudden death Paternal Grandfather   . Hyperlipidemia Neg Hx   . Heart attack Neg Hx   . Cancer Mother     breast  . Mitral valve prolapse Mother   . Cancer Sister     breast   Social History  Substance Use Topics  . Smoking status: Never Smoker   . Smokeless tobacco: None  . Alcohol Use: No    Review of Systems  Constitutional: Negative for fever and chills.  Respiratory: Positive for shortness of breath.   Gastrointestinal: Positive for abdominal pain.  Genitourinary: Positive for penile pain. Negative for hematuria.       Dark urine  Musculoskeletal: Positive for back pain.  All other systems reviewed and are negative.  Allergies   Bee venom  Home Medications   Prior to Admission medications   Medication Sig Start Date End Date Taking? Authorizing Provider  calcium carbonate (TUMS EX) 750 MG chewable tablet Chew 2 tablets by mouth daily as needed. For indigestion    Yes Historical Provider, MD  ibuprofen (ADVIL,MOTRIN) 800 MG tablet Take 1 tablet (800 mg total) by mouth 3 (three) times daily. 02/12/15   Eber Hong, MD  oxyCODONE-acetaminophen (PERCOCET) 5-325 MG per tablet Take 1 tablet by mouth every 4 (four) hours as needed. 02/12/15   Eber Hong, MD  promethazine (PHENERGAN) 25 MG tablet Take 1 tablet (25 mg total) by mouth every 6 (six) hours as needed for nausea or vomiting. 02/12/15   Eber Hong, MD  tamsulosin (FLOMAX) 0.4 MG CAPS capsule Take 1 capsule (0.4 mg total) by mouth 2 (two) times daily. 02/12/15   Eber Hong, MD   BP 133/84 mmHg  Pulse 93  Temp(Src) 98 F (36.7 C) (Oral)  Resp 18  Ht 5\' 10"  (1.778 m)  Wt 216 lb (97.977 kg)  BMI 30.99 kg/m2  SpO2 97% Physical Exam  Constitutional: He appears well-developed and well-nourished. No distress.  HENT:  Head: Normocephalic and atraumatic.  Mouth/Throat: Oropharynx is clear and moist. No oropharyngeal exudate.  Eyes: Conjunctivae and EOM are normal. Pupils are equal,  round, and reactive to light. Right eye exhibits no discharge. Left eye exhibits no discharge. No scleral icterus.  Neck: Normal range of motion. Neck supple. No JVD present. No thyromegaly present.  Cardiovascular: Regular rhythm, normal heart sounds and intact distal pulses.  Tachycardia present.  Exam reveals no gallop and no friction rub.   No murmur heard. Mild tachycardia.   Pulmonary/Chest: Effort normal and breath sounds normal. No respiratory distress. He has no wheezes. He has no rales.  Abdominal: Soft. Bowel sounds are normal. He exhibits no distension and no mass. There is no tenderness.  No abdominal or CVA tenderness.   Musculoskeletal: Normal range of motion. He exhibits  no edema or tenderness.  Lymphadenopathy:    He has no cervical adenopathy.  Neurological: He is alert. Coordination normal.  Skin: Skin is warm and dry. No rash noted. No erythema.  Psychiatric: He has a normal mood and affect. His behavior is normal.  Appears uncomfortable.   Nursing note and vitals reviewed.   ED Course  Procedures (including critical care time) DIAGNOSTIC STUDIES: Oxygen Saturation is 100% on RA, normal by my interpretation.    COORDINATION OF CARE: 9:08 PM Discussed treatment plan with pt at bedside and pt agreed to plan.   Labs Review Labs Reviewed  URINALYSIS, ROUTINE W REFLEX MICROSCOPIC (NOT AT [facility]) - Abnormal; Notable for the following:    Hgb urine dipstick TRACE (*)    Urobilinogen, UA 2.0 (*)    All other components within normal limits  BASIC METABOLIC PANEL - Abnormal; Notable for the following:    Glucose, Bld 114 (*)    All other components within normal limits  URINE MICROSCOPIC-ADD ON - Abnormal; Notable for the following:    Crystals CA OXALATE CRYSTALS (*)    All other components within normal limits  CBC    Imaging Review Ct Renal Stone Study  02/12/2015   CLINICAL DATA:  Acute onset of right flank pain, radiating to the abdomen. Penile pain and shortness of breath. Dark urine. Initial encounter.  EXAM: CT ABDOMEN AND PELVIS WITHOUT CONTRAST  TECHNIQUE: Multidetector CT imaging of the abdomen and pelvis was performed following the standard protocol without IV contrast.  COMPARISON:  CT of the abdomen and pelvis from 10/15/2012  FINDINGS: The visualized lung bases are clear.  The liver and spleen are unremarkable in appearance. The gallbladder is within normal limits. The pancreas and adrenal glands are unremarkable.  Minimal right-sided hydronephrosis is noted, with mild right-sided perinephric stranding. A 3 mm obstructing stone is noted distally at the right vesicoureteral junction. The left kidney is unremarkable in appearance.  No free  fluid is identified. The small bowel is unremarkable in appearance. The stomach is within normal limits. No acute vascular abnormalities are seen.  The appendix is normal in caliber, without evidence of appendicitis. The colon is unremarkable in appearance.  The bladder is mildly distended and otherwise unremarkable. The prostate is normal in size. No inguinal lymphadenopathy is seen.  No acute osseous abnormalities are identified.  IMPRESSION: Minimal right-sided hydronephrosis, with a 3 mm obstructing stone noted distally at the right vesicoureteral junction.   Electronically Signed   By: Roanna Raider M.D.   On: 02/12/2015 21:33   I have personally reviewed and evaluated these images and lab results as part of my medical decision-making.    MDM   Final diagnoses:  Kidney stone   The patient has flank pain on the right, there are some urinary symptoms, there has  been some dark colored urine. Urinalysis reveals hematuria, vital signs are normal, CT scan reveals a kidney stone which is 3 mm, distal ureter, patient informed of the results, improved with medications, stable for discharge. He will follow up outpatient as needed. He has expressed his understanding of the instructions and the indications for return.  I have personally viewed and interpreted the imaging and agree with radiologist interpretation.   Meds given in ED:  Medications  ketorolac (TORADOL) 30 MG/ML injection 30 mg (30 mg Intravenous Given 02/12/15 2142)  HYDROmorphone (DILAUDID) injection 1 mg (1 mg Intravenous Given 02/12/15 2150)    New Prescriptions   IBUPROFEN (ADVIL,MOTRIN) 800 MG TABLET    Take 1 tablet (800 mg total) by mouth 3 (three) times daily.   OXYCODONE-ACETAMINOPHEN (PERCOCET) 5-325 MG PER TABLET    Take 1 tablet by mouth every 4 (four) hours as needed.   PROMETHAZINE (PHENERGAN) 25 MG TABLET    Take 1 tablet (25 mg total) by mouth every 6 (six) hours as needed for nausea or vomiting.   TAMSULOSIN (FLOMAX)  0.4 MG CAPS CAPSULE    Take 1 capsule (0.4 mg total) by mouth 2 (two) times daily.    I personally performed the services described in this documentation, which was scribed in my presence. The recorded information has been reviewed and is accurate.      Eber Hong, MD 02/12/15 2215

## 2017-07-26 ENCOUNTER — Other Ambulatory Visit: Payer: Self-pay

## 2017-07-26 ENCOUNTER — Encounter (HOSPITAL_BASED_OUTPATIENT_CLINIC_OR_DEPARTMENT_OTHER): Payer: Self-pay | Admitting: *Deleted

## 2017-07-26 DIAGNOSIS — K2970 Gastritis, unspecified, without bleeding: Secondary | ICD-10-CM | POA: Diagnosis not present

## 2017-07-26 DIAGNOSIS — Z79899 Other long term (current) drug therapy: Secondary | ICD-10-CM | POA: Insufficient documentation

## 2017-07-26 DIAGNOSIS — I10 Essential (primary) hypertension: Secondary | ICD-10-CM | POA: Insufficient documentation

## 2017-07-26 DIAGNOSIS — R1013 Epigastric pain: Secondary | ICD-10-CM | POA: Diagnosis present

## 2017-07-26 LAB — COMPREHENSIVE METABOLIC PANEL
ALBUMIN: 4.2 g/dL (ref 3.5–5.0)
ALK PHOS: 51 U/L (ref 38–126)
ALT: 48 U/L (ref 17–63)
AST: 31 U/L (ref 15–41)
Anion gap: 9 (ref 5–15)
BILIRUBIN TOTAL: 1.1 mg/dL (ref 0.3–1.2)
BUN: 14 mg/dL (ref 6–20)
CALCIUM: 9.3 mg/dL (ref 8.9–10.3)
CO2: 27 mmol/L (ref 22–32)
Chloride: 103 mmol/L (ref 101–111)
Creatinine, Ser: 0.9 mg/dL (ref 0.61–1.24)
GLUCOSE: 124 mg/dL — AB (ref 65–99)
Potassium: 3.4 mmol/L — ABNORMAL LOW (ref 3.5–5.1)
Sodium: 139 mmol/L (ref 135–145)
TOTAL PROTEIN: 7.1 g/dL (ref 6.5–8.1)

## 2017-07-26 LAB — CBC
HCT: 43.6 % (ref 39.0–52.0)
HEMOGLOBIN: 15.3 g/dL (ref 13.0–17.0)
MCH: 32.3 pg (ref 26.0–34.0)
MCHC: 35.1 g/dL (ref 30.0–36.0)
MCV: 92.2 fL (ref 78.0–100.0)
Platelets: 242 10*3/uL (ref 150–400)
RBC: 4.73 MIL/uL (ref 4.22–5.81)
RDW: 12.2 % (ref 11.5–15.5)
WBC: 9.2 10*3/uL (ref 4.0–10.5)

## 2017-07-26 LAB — LIPASE, BLOOD: Lipase: 39 U/L (ref 11–51)

## 2017-07-26 NOTE — ED Triage Notes (Signed)
Epigastric pain x 1 day. Radiation into back.  Denies N/V/D.  Reports tenderness on palpation.

## 2017-07-27 ENCOUNTER — Emergency Department (HOSPITAL_BASED_OUTPATIENT_CLINIC_OR_DEPARTMENT_OTHER): Payer: 59

## 2017-07-27 ENCOUNTER — Emergency Department (HOSPITAL_BASED_OUTPATIENT_CLINIC_OR_DEPARTMENT_OTHER)
Admission: EM | Admit: 2017-07-27 | Discharge: 2017-07-27 | Disposition: A | Discharge status: Home / Self Care | Payer: 59 | Attending: Emergency Medicine | Admitting: Emergency Medicine

## 2017-07-27 DIAGNOSIS — R1013 Epigastric pain: Secondary | ICD-10-CM

## 2017-07-27 DIAGNOSIS — K2970 Gastritis, unspecified, without bleeding: Secondary | ICD-10-CM

## 2017-07-27 LAB — URINALYSIS, ROUTINE W REFLEX MICROSCOPIC
Bilirubin Urine: NEGATIVE
Glucose, UA: NEGATIVE mg/dL
Hgb urine dipstick: NEGATIVE
KETONES UR: NEGATIVE mg/dL
LEUKOCYTES UA: NEGATIVE
NITRITE: NEGATIVE
PROTEIN: NEGATIVE mg/dL
Specific Gravity, Urine: 1.025 (ref 1.005–1.030)
pH: 6 (ref 5.0–8.0)

## 2017-07-27 LAB — TROPONIN I: Troponin I: <0.03 ng/mL (ref <0.03)

## 2017-07-27 MED ORDER — PANTOPRAZOLE SODIUM 40 MG PO TBEC
40.0000 mg | DELAYED_RELEASE_TABLET | Freq: Once | ORAL | Status: AC
  Administered 2017-07-27: 40 mg via ORAL
  Filled 2017-07-27: qty 1

## 2017-07-27 MED ORDER — SUCRALFATE 1 G PO TABS: 1.0000 g | ORAL_TABLET | Freq: Four times a day (QID) | ORAL | 0 refills | Status: DC

## 2017-07-27 MED ORDER — OMEPRAZOLE 20 MG PO CPDR: 20.0000 mg | DELAYED_RELEASE_CAPSULE | Freq: Two times a day (BID) | ORAL | 0 refills | Status: DC

## 2017-07-27 MED ORDER — SUCRALFATE 1 G PO TABS
1.0000 g | ORAL_TABLET | Freq: Once | ORAL | Status: AC
  Administered 2017-07-27: 1 g via ORAL
  Filled 2017-07-27: qty 1

## 2017-07-27 NOTE — ED Provider Notes (Signed)
[facility] EMERGENCY DEPARTMENT Provider Note   CSN: [id number] Arrival date & time: 07/26/17  2106     History   Chief Complaint Chief Complaint  Patient presents with  . Abdominal Pain    HPI Johnathan White is a 52 y.o. male.  Chief complaint is epigastric abdominal pain  HPI: 52 year old male.  Epigastric abdominal pain for the last few days.  Burning.  Sometimes sharp.  It does not seem to be affected by food.  Does not awaken him at night.  No fatty food intolerance.  No spicy food intolerance.  No alcohol or tobacco.  Frequent caffeine.  No recent antibiotics or steroids.  NSAID use several days per week due to disc disease.  No frank chest pain or pressure.  No shortness of breath.  No history of hypertension or diabetes.  He is overweight.  Does not exercise.  No family history of premature cardiac disease.  No exertional symptoms.  Past Medical History:  Diagnosis Date  . Back pain, chronic   . Chronic heartburn   . Hypertension   . Spontaneous pneumothorax    as a child    Patient Active Problem List   Diagnosis Date Noted  . Low back pain 04/20/2011  . HYPERTENSION 06/11/2007  . EXTERNAL HEMORRHOIDS 06/11/2007  . GASTROESOPHAGEAL REFLUX DISEASE, CHRONIC 06/11/2007    Past Surgical History:  Procedure Laterality Date  . WISDOM TOOTH EXTRACTION         Home Medications    Prior to Admission medications   Medication Sig Start Date End Date Taking? Authorizing Provider  calcium carbonate (TUMS EX) 750 MG chewable tablet Chew 2 tablets by mouth daily as needed. For indigestion     [provider]  omeprazole (PRILOSEC) 20 MG capsule Take 1 capsule (20 mg total) by mouth 2 (two) times daily. 07/27/17   Rolland Porter, MD  sucralfate (CARAFATE) 1 g tablet Take 1 tablet (1 g total) by mouth 4 (four) times daily. 07/27/17   Rolland Porter, MD    Family History Family History  Problem Relation Age of Onset  . Hypertension Father   . Diabetes  Maternal Aunt   . Diabetes Maternal Grandmother   . Sudden death Paternal Grandfather   . Hyperlipidemia Neg Hx   . Heart attack Neg Hx   . Cancer Mother        breast  . Mitral valve prolapse Mother   . Cancer Sister        breast    Social History Social History   Tobacco Use  . Smoking status: Never Smoker  Substance Use Topics  . Alcohol use: No  . Drug use: No     Allergies   Bee venom   Review of Systems Review of Systems  Constitutional: Negative for appetite change, chills, diaphoresis, fatigue and fever.  HENT: Negative for mouth sores, sore throat and trouble swallowing.   Eyes: Negative for visual disturbance.  Respiratory: Negative for cough, chest tightness, shortness of breath and wheezing.   Cardiovascular: Negative for chest pain.  Gastrointestinal: Positive for abdominal pain. Negative for abdominal distention, diarrhea, nausea and vomiting.  Endocrine: Negative for polydipsia, polyphagia and polyuria.  Genitourinary: Negative for dysuria, frequency and hematuria.  Musculoskeletal: Negative for gait problem.  Skin: Negative for color change, pallor and rash.  Neurological: Negative for dizziness, syncope, light-headedness and headaches.  Hematological: Does not bruise/bleed easily.  Psychiatric/Behavioral: Negative for behavioral problems and confusion.     Physical Exam  Updated Vital Signs BP (!) 136/94 (BP Location: Right Arm)   Pulse 72   Temp 98.2 F (36.8 C) (Oral)   Resp 16   Ht 5\' 10"  (1.778 m)   Wt 96.6 kg (213 lb)   SpO2 99%   BMI 30.56 kg/m   Physical Exam  Constitutional: He is oriented to person, place, and time. He appears well-developed and well-nourished. No distress.  HENT:  Head: Normocephalic.  Eyes: Conjunctivae are normal. Pupils are equal, round, and reactive to light. No scleral icterus.  Neck: Normal range of motion. Neck supple. No thyromegaly present.  Cardiovascular: Normal rate and regular rhythm. Exam reveals  no gallop and no friction rub.  No murmur heard. Pulmonary/Chest: Effort normal and breath sounds normal. No respiratory distress. He has no wheezes. He has no rales.  Abdominal: Soft. Bowel sounds are normal. He exhibits no distension. There is no tenderness. There is no rebound.  States he feels "mass".  He is showing me his xiphoid.  He does have some subxiphoid epigastric tenderness.  No peritoneal irritation.  Negative Murphy sign.  Musculoskeletal: Normal range of motion.  Neurological: He is alert and oriented to person, place, and time.  Skin: Skin is warm and dry. No rash noted.  Psychiatric: He has a normal mood and affect. His behavior is normal.     ED Treatments / Results  Labs (all labs ordered are listed, but only abnormal results are displayed) Labs Reviewed  COMPREHENSIVE METABOLIC PANEL - Abnormal; Notable for the following components:      Result Value   Potassium 3.4 (*)    Glucose, Bld 124 (*)    All other components within normal limits  URINALYSIS, ROUTINE W REFLEX MICROSCOPIC - Abnormal; Notable for the following components:   APPearance CLOUDY (*)    All other components within normal limits  LIPASE, BLOOD  CBC  TROPONIN I    EKG  EKG Interpretation  Date/Time:  Friday July 26 2017 21:24:02 EST Ventricular Rate:  72 PR Interval:  162 QRS Duration: 98 QT Interval:  370 QTC Calculation: 405 R Axis:   45 Text Interpretation:  Normal sinus rhythm Normal ECG Confirmed by Rolland PorterJames, Corrina Steffensen ([id number]) on 07/27/2017 12:40:32 AM       Radiology Dg Chest 2 View  Result Date: 07/27/2017 CLINICAL DATA:  Acute onset chest pain since yesterday. Upper abdominal pain radiating to the back. EXAM: CHEST  2 VIEW COMPARISON:  None. FINDINGS: Cardiomediastinal silhouette is normal. No pleural effusions or focal consolidations. Trachea projects midline and there is no pneumothorax. Soft tissue planes and included osseous structures are non-suspicious. IMPRESSION: Negative.  Electronically Signed   By: Awilda Metroourtnay  Bloomer M.D.   On: 07/27/2017 01:08    Procedures Procedures (including critical care time)  Medications Ordered in ED Medications  pantoprazole (PROTONIX) EC tablet 40 mg (40 mg Oral Given 07/27/17 0126)  sucralfate (CARAFATE) tablet 1 g (1 g Oral Given 07/27/17 0126)     Initial Impression / Assessment and Plan / ED Course  I have reviewed the triage vital signs and the nursing notes.  Pertinent labs & imaging results that were available during my care of the patient were reviewed by me and considered in my medical decision making (see chart for details).    The acid related.  Normal enzymes.  EKG, chest x-ray, troponin normal.  Plan Prilosec, Carafate.  Avoid caffeine.  Small meals.  Primary care, or GI follow-up if not improving.  Final Clinical Impressions(s) / ED  Diagnoses   Final diagnoses:  Epigastric pain  Gastritis without bleeding, unspecified chronicity, unspecified gastritis type    ED Discharge Orders        Ordered    omeprazole (PRILOSEC) 20 MG capsule  2 times daily     07/27/17 0126    sucralfate (CARAFATE) 1 g tablet  4 times daily     07/27/17 0126       Rolland Porter, MD 07/27/17 [phone]

## 2017-07-27 NOTE — ED Notes (Signed)
Pt understood dc material. NAD noted. Scripts given at dc 

## 2017-07-27 NOTE — Discharge Instructions (Signed)
Decrease your caffeine intake. Prilosec twice per day until symptoms improve.  Take once per day for at least 2 weeks Carafate 3 times per day until symptoms improve. See your doctor, or [facility] if not improving.

## 2017-07-27 NOTE — ED Notes (Signed)
ED Provider at bedside. 

## 2021-04-04 ENCOUNTER — Emergency Department (INDEPENDENT_AMBULATORY_CARE_PROVIDER_SITE_OTHER)
Admission: EM | Admit: 2021-04-04 | Discharge: 2021-04-04 | Disposition: A | Discharge status: Home / Self Care | Payer: 59 | Source: Home / Self Care | Attending: Family Medicine | Admitting: Family Medicine

## 2021-04-04 ENCOUNTER — Other Ambulatory Visit: Payer: Self-pay

## 2021-04-04 DIAGNOSIS — Z20828 Contact with and (suspected) exposure to other viral communicable diseases: Secondary | ICD-10-CM | POA: Diagnosis not present

## 2021-04-04 DIAGNOSIS — R6889 Other general symptoms and signs: Secondary | ICD-10-CM

## 2021-04-04 MED ORDER — OSELTAMIVIR PHOSPHATE 75 MG PO CAPS: 75.0000 mg | ORAL_CAPSULE | Freq: Two times a day (BID) | ORAL | 0 refills | Status: DC

## 2021-04-04 NOTE — ED Triage Notes (Signed)
Pt c/o cough, bodyaches, and sore throat that started last night. Son tested pos for Flu A today. No OTC meds tried yet.

## 2021-04-04 NOTE — ED Provider Notes (Signed)
[facility]    CSN: [id number] Arrival date & time: 04/04/21  1138      History   Chief Complaint Chief Complaint  Patient presents with   Cough   Generalized Body Aches   Sore Throat    HPI Johnathan White is a 55 y.o. male.   HPI  Patient's teenage son was diagnosed with influenza yesterday.  Now he has headache body aches fatigue and cough over the last 24 hours.  He feels like he likely has influenza.  He did not get a flu shot (usually does not).  No underlying lung disease, asthma, COPD.  Has history of hypertension not currently on medication.  Past Medical History:  Diagnosis Date   Back pain, chronic    Chronic heartburn    Hypertension    Spontaneous pneumothorax    as a child    Patient Active Problem List   Diagnosis Date Noted   Low back pain 04/20/2011   HYPERTENSION 06/11/2007   EXTERNAL HEMORRHOIDS 06/11/2007   GASTROESOPHAGEAL REFLUX DISEASE, CHRONIC 06/11/2007    Past Surgical History:  Procedure Laterality Date   WISDOM TOOTH EXTRACTION         Home Medications    Prior to Admission medications   Medication Sig Start Date End Date Taking? Authorizing Provider  oseltamivir (TAMIFLU) 75 MG capsule Take 1 capsule (75 mg total) by mouth every 12 (twelve) hours. 04/04/21  Yes Eustace Moore, MD  calcium carbonate (TUMS EX) 750 MG chewable tablet Chew 2 tablets by mouth daily as needed. For indigestion     [provider]  omeprazole (PRILOSEC) 20 MG capsule Take 1 capsule (20 mg total) by mouth 2 (two) times daily. 07/27/17   Rolland Porter, MD  sucralfate (CARAFATE) 1 g tablet Take 1 tablet (1 g total) by mouth 4 (four) times daily. 07/27/17   Rolland Porter, MD    Family History Family History  Problem Relation Age of Onset   Hypertension Father    Diabetes Maternal Aunt    Diabetes Maternal Grandmother    Sudden death Paternal Grandfather    Hyperlipidemia Neg Hx    Heart attack Neg Hx    Cancer Mother         breast   Mitral valve prolapse Mother    Cancer Sister        breast    Social History Social History   Tobacco Use   Smoking status: Never   Smokeless tobacco: Never  Vaping Use   Vaping Use: Never used  Substance Use Topics   Alcohol use: No   Drug use: No     Allergies   Bee venom   Review of Systems Review of Systems   Physical Exam Triage Vital Signs ED Triage Vitals [04/04/21 1149]  Enc Vitals Group     BP (!) 149/84     Pulse Rate 96     Resp 18     Temp 99.5 F (37.5 C)     Temp Source Oral     SpO2 96 %     Weight      Height      Head Circumference      Peak Flow      Pain Score 4     Pain Loc      Pain Edu?      Excl. in GC?    No data found.  Updated Vital Signs BP (!) 149/84 (BP Location: Right Arm)  Pulse 96   Temp 99.5 F (37.5 C) (Oral)   Resp 18   SpO2 96%   Visual Acuity Right Eye Distance:   Left Eye Distance:   Bilateral Distance:    Right Eye Near:   Left Eye Near:    Bilateral Near:     Physical Exam   UC Treatments / Results  Labs (all labs ordered are listed, but only abnormal results are displayed) Labs Reviewed - No data to display  EKG   Radiology No results found.  Procedures Procedures (including critical care time)  Medications Ordered in UC Medications - No data to display  Initial Impression / Assessment and Plan / UC Course  I have reviewed the triage vital signs and the nursing notes.  Pertinent labs & imaging results that were available during my care of the patient were reviewed by me and considered in my medical decision making (see chart for details).     Will treat for influenza.  Direct exposure in the home with symptoms consistent of flu.  Management reviewed.  Symptomatic care discussed Final Clinical Impressions(s) / UC Diagnoses   Final diagnoses:  Exposure to the flu  Flu-like symptoms     Discharge Instructions      Take Tamiflu 2 times a day for 5 days Drink lots  of water May take over-the-counter cough and cold medicine in addition Call for questions or problems     ED Prescriptions     Medication Sig Dispense Auth. Provider   oseltamivir (TAMIFLU) 75 MG capsule Take 1 capsule (75 mg total) by mouth every 12 (twelve) hours. 10 capsule Eustace Moore, MD      PDMP not reviewed this encounter.   Eustace Moore, MD 04/04/21 1710

## 2021-04-04 NOTE — Discharge Instructions (Signed)
Take Tamiflu 2 times a day for 5 days Drink lots of water May take over-the-counter cough and cold medicine in addition Call for questions or problems

## 2021-09-08 ENCOUNTER — Observation Stay (HOSPITAL_BASED_OUTPATIENT_CLINIC_OR_DEPARTMENT_OTHER)
Admission: EM | Admit: 2021-09-08 | Discharge: 2021-09-09 | Disposition: A | Discharge status: Home / Self Care | Payer: 59 | Attending: Family Medicine | Admitting: Family Medicine

## 2021-09-08 ENCOUNTER — Other Ambulatory Visit: Payer: Self-pay

## 2021-09-08 ENCOUNTER — Emergency Department (HOSPITAL_BASED_OUTPATIENT_CLINIC_OR_DEPARTMENT_OTHER): Payer: 59

## 2021-09-08 ENCOUNTER — Encounter (HOSPITAL_BASED_OUTPATIENT_CLINIC_OR_DEPARTMENT_OTHER): Payer: Self-pay | Admitting: Emergency Medicine

## 2021-09-08 DIAGNOSIS — R1030 Lower abdominal pain, unspecified: Secondary | ICD-10-CM | POA: Diagnosis present

## 2021-09-08 DIAGNOSIS — K5730 Diverticulosis of large intestine without perforation or abscess without bleeding: Secondary | ICD-10-CM | POA: Diagnosis not present

## 2021-09-08 DIAGNOSIS — K760 Fatty (change of) liver, not elsewhere classified: Secondary | ICD-10-CM | POA: Diagnosis not present

## 2021-09-08 DIAGNOSIS — Z79899 Other long term (current) drug therapy: Secondary | ICD-10-CM | POA: Insufficient documentation

## 2021-09-08 DIAGNOSIS — I10 Essential (primary) hypertension: Secondary | ICD-10-CM | POA: Diagnosis not present

## 2021-09-08 DIAGNOSIS — K529 Noninfective gastroenteritis and colitis, unspecified: Secondary | ICD-10-CM

## 2021-09-08 DIAGNOSIS — K5792 Diverticulitis of intestine, part unspecified, without perforation or abscess without bleeding: Secondary | ICD-10-CM | POA: Diagnosis not present

## 2021-09-08 LAB — CBC WITH DIFFERENTIAL/PLATELET
Abs Immature Granulocytes: 0.05 10*3/uL (ref 0.00–0.07)
Basophils Absolute: 0.1 10*3/uL (ref 0.0–0.1)
Basophils Relative: 1 %
Eosinophils Absolute: 0.2 10*3/uL (ref 0.0–0.5)
Eosinophils Relative: 2 %
HCT: 42.5 % (ref 39.0–52.0)
Hemoglobin: 15.2 g/dL (ref 13.0–17.0)
Immature Granulocytes: 0 %
Lymphocytes Relative: 14 %
Lymphs Abs: 1.7 10*3/uL (ref 0.7–4.0)
MCH: 32.8 pg (ref 26.0–34.0)
MCHC: 35.8 g/dL (ref 30.0–36.0)
MCV: 91.6 fL (ref 80.0–100.0)
Monocytes Absolute: 1.2 10*3/uL — ABNORMAL HIGH (ref 0.1–1.0)
Monocytes Relative: 10 %
Neutro Abs: 8.8 10*3/uL — ABNORMAL HIGH (ref 1.7–7.7)
Neutrophils Relative %: 73 %
Platelets: 231 10*3/uL (ref 150–400)
RBC: 4.64 MIL/uL (ref 4.22–5.81)
RDW: 12.4 % (ref 11.5–15.5)
WBC: 12 10*3/uL — ABNORMAL HIGH (ref 4.0–10.5)
nRBC: 0 % (ref 0.0–0.2)

## 2021-09-08 LAB — URINALYSIS, ROUTINE W REFLEX MICROSCOPIC
Bilirubin Urine: NEGATIVE
Glucose, UA: NEGATIVE mg/dL
Ketones, ur: NEGATIVE mg/dL
Leukocytes,Ua: NEGATIVE
Nitrite: NEGATIVE
Protein, ur: NEGATIVE mg/dL
Specific Gravity, Urine: 1.025 (ref 1.005–1.030)
pH: 5.5 (ref 5.0–8.0)

## 2021-09-08 LAB — COMPREHENSIVE METABOLIC PANEL
ALT: 46 U/L — ABNORMAL HIGH (ref 0–44)
AST: 30 U/L (ref 15–41)
Albumin: 4.1 g/dL (ref 3.5–5.0)
Alkaline Phosphatase: 52 U/L (ref 38–126)
Anion gap: 8 (ref 5–15)
BUN: 12 mg/dL (ref 6–20)
CO2: 24 mmol/L (ref 22–32)
Calcium: 8.7 mg/dL — ABNORMAL LOW (ref 8.9–10.3)
Chloride: 105 mmol/L (ref 98–111)
Creatinine, Ser: 1.07 mg/dL (ref 0.61–1.24)
GFR, Estimated: >60 mL/min (ref >60)
Glucose, Bld: 113 mg/dL — ABNORMAL HIGH (ref 70–99)
Potassium: 3.5 mmol/L (ref 3.5–5.1)
Sodium: 137 mmol/L (ref 135–145)
Total Bilirubin: 2 mg/dL — ABNORMAL HIGH (ref 0.3–1.2)
Total Protein: 7.1 g/dL (ref 6.5–8.1)

## 2021-09-08 LAB — URINALYSIS, MICROSCOPIC (REFLEX)

## 2021-09-08 MED ORDER — MORPHINE SULFATE (PF) 4 MG/ML IV SOLN
4.0000 mg | Freq: Once | INTRAVENOUS | Status: AC
  Administered 2021-09-08: 4 mg via INTRAVENOUS
  Filled 2021-09-08: qty 1

## 2021-09-08 MED ORDER — IOHEXOL 300 MG/ML  SOLN
100.0000 mL | Freq: Once | INTRAMUSCULAR | Status: AC | PRN
  Administered 2021-09-08: 100 mL via INTRAVENOUS

## 2021-09-08 MED ORDER — HYDROCODONE-ACETAMINOPHEN 5-325 MG PO TABS
1.0000 | ORAL_TABLET | ORAL | Status: DC | PRN
  Administered 2021-09-08: 2 via ORAL
  Filled 2021-09-08: qty 2

## 2021-09-08 MED ORDER — ACETAMINOPHEN 650 MG RE SUPP: 650.0000 mg | Freq: Four times a day (QID) | RECTAL | Status: DC | PRN

## 2021-09-08 MED ORDER — TRAMADOL HCL 50 MG PO TABS: 50.0000 mg | ORAL_TABLET | Freq: Four times a day (QID) | ORAL | Status: DC | PRN

## 2021-09-08 MED ORDER — ONDANSETRON HCL 4 MG/2ML IJ SOLN
4.0000 mg | Freq: Once | INTRAMUSCULAR | Status: AC
  Administered 2021-09-08: 4 mg via INTRAVENOUS
  Filled 2021-09-08: qty 2

## 2021-09-08 MED ORDER — SODIUM CHLORIDE 0.9 % IV SOLN: INTRAVENOUS | Status: DC | PRN

## 2021-09-08 MED ORDER — CEFAZOLIN SODIUM-DEXTROSE 1-4 GM/50ML-% IV SOLN
1.0000 g | Freq: Once | INTRAVENOUS | Status: AC
  Administered 2021-09-08: 1 g via INTRAVENOUS
  Filled 2021-09-08: qty 50

## 2021-09-08 MED ORDER — CIPROFLOXACIN IN D5W 400 MG/200ML IV SOLN: 400.0000 mg | Freq: Two times a day (BID) | INTRAVENOUS | Status: DC

## 2021-09-08 MED ORDER — KETOROLAC TROMETHAMINE 15 MG/ML IJ SOLN
15.0000 mg | Freq: Once | INTRAMUSCULAR | Status: AC
  Administered 2021-09-08: 15 mg via INTRAVENOUS
  Filled 2021-09-08: qty 1

## 2021-09-08 MED ORDER — METRONIDAZOLE 500 MG/100ML IV SOLN
500.0000 mg | Freq: Two times a day (BID) | INTRAVENOUS | Status: DC
  Administered 2021-09-08 (×2): 500 mg via INTRAVENOUS
  Filled 2021-09-08 (×2): qty 100

## 2021-09-08 MED ORDER — ACETAMINOPHEN 325 MG PO TABS
650.0000 mg | ORAL_TABLET | Freq: Four times a day (QID) | ORAL | Status: DC | PRN
Start: 2021-09-08 — End: 2021-09-09
  Administered 2021-09-08: 650 mg via ORAL
  Filled 2021-09-08: qty 2

## 2021-09-08 MED ORDER — OXYCODONE HCL 5 MG PO TABS
5.0000 mg | ORAL_TABLET | Freq: Four times a day (QID) | ORAL | Status: DC | PRN
  Administered 2021-09-08: 10 mg via ORAL
  Filled 2021-09-08: qty 2

## 2021-09-08 MED ORDER — ENOXAPARIN SODIUM 40 MG/0.4ML IJ SOSY
40.0000 mg | PREFILLED_SYRINGE | INTRAMUSCULAR | Status: DC
  Administered 2021-09-08: 40 mg via SUBCUTANEOUS
  Filled 2021-09-08: qty 0.4

## 2021-09-08 MED ORDER — SODIUM CHLORIDE 0.9 % IV SOLN
2.0000 g | Freq: Every day | INTRAVENOUS | Status: DC
  Administered 2021-09-08: 2 g via INTRAVENOUS
  Filled 2021-09-08: qty 20

## 2021-09-08 NOTE — Assessment & Plan Note (Addendum)
Patient follows with GI as an outpatient. Mildly elevated ALT and bilirubin. Last known baseline from 2019 and unlikely accurate. Asymptomatic. Improved on day of discharge. ?

## 2021-09-08 NOTE — Progress Notes (Signed)
?  Transition of Care (TOC) Screening Note ? ? ?Patient Details  ?Name: Johnathan White ?Date of Birth: [birth year] ? ? ?Transition of Care (TOC) CM/SW Contact:    ?Giles Currie, LCSW ?Phone Number: ?09/08/2021, 1:05 PM ? ? ? ?Transition of Care Department [facility]) has reviewed patient and no TOC needs have been identified at this time. We will continue to monitor patient advancement through interdisciplinary progression rounds. If new patient transition needs arise, please place a TOC consult. ? ? ?

## 2021-09-08 NOTE — Assessment & Plan Note (Addendum)
Sigmoid region. First episode. Patient was on Ciprofloxacin as an outpatient for presumed UTI. Discussed with general surgery who initially recommend Ciprofloxacin and Flagyl combination, but switched to Ceftriaxone/Flagyl. Patient with improved symptoms. General surgery transitioned to Augmentin for discharge. Patient discharged to complete a 14 day course of antibiotics. ?

## 2021-09-08 NOTE — ED Notes (Signed)
ED Provider at bedside. 

## 2021-09-08 NOTE — H&P (Signed)
?History and Physical  ? ? ?Patient: Johnathan White DOB: Feb 26, 1966 ?DOA: 09/08/2021 ?DOS: the patient was seen and examined on 09/08/2021 ?PCP: Sigmund Hazel, MD  ?Patient coming from: Home ? ?Chief Complaint:  ?Chief Complaint  ?Patient presents with  ? Abdominal Pain  ? ?HPI: Johnathan White is a 56 y.o. male with medical history significant of hypertension, NAFLD. Patient presented secondary to suprapubic abdominal pain. Symptoms started about 1.5 weeks ago. He initially thought he had a kidney stone. He was evaluated for possible urinary infection with urinalysis, which was negative, but received Flomax for possible kidney stone and Ciprofloxacin for possible infection; he was given a 10 days supply of antibiotics on 3/24 and has not completed his course. Symptoms persisted/worsened to the point where he decided to seek evaluation in the ED. No fevers, nausea, vomiting. One episode of chills yesterday. No dysuria or hematuria. ?  ? ?Review of Systems: As mentioned in the history of present illness. All other systems reviewed and are negative. ?Past Medical History:  ?Diagnosis Date  ? Back pain, chronic   ? Chronic heartburn   ? Hypertension   ? Spontaneous pneumothorax   ? as a child  ? ?Past Surgical History:  ?Procedure Laterality Date  ? WISDOM TOOTH EXTRACTION    ? ?Social History:  reports that he has never smoked. He has never used smokeless tobacco. He reports that he does not drink alcohol and does not use drugs. ? ?Allergies  ?Allergen Reactions  ? Bee Venom   ? ? ?Family History  ?Problem Relation Age of Onset  ? Hypertension Father   ? Diabetes Maternal Aunt   ? Diabetes Maternal Grandmother   ? Sudden death Paternal Grandfather   ? Hyperlipidemia Neg Hx   ? Heart attack Neg Hx   ? Cancer Mother   ?     breast  ? Mitral valve prolapse Mother   ? Cancer Sister   ?     breast  ? ? ?Prior to Admission medications   ?Medication Sig Start Date End Date Taking? Authorizing Provider  ?calcium  carbonate (TUMS EX) 750 MG chewable tablet Chew 2 tablets by mouth daily as needed. For indigestion     [provider]  ?omeprazole (PRILOSEC) 20 MG capsule Take 1 capsule (20 mg total) by mouth 2 (two) times daily. 07/27/17   Rolland Porter, MD  ?oseltamivir (TAMIFLU) 75 MG capsule Take 1 capsule (75 mg total) by mouth every 12 (twelve) hours. 04/04/21   Eustace Moore, MD  ?sucralfate (CARAFATE) 1 g tablet Take 1 tablet (1 g total) by mouth 4 (four) times daily. 07/27/17   Rolland Porter, MD  ? ? ?Physical Exam: ?Vitals:  ? 09/08/21 1000 09/08/21 1015 09/08/21 1100 09/08/21 1149  ?BP: 119/83 119/82  129/90  ?Pulse: 71 72  76  ?Resp:  16  16  ?Temp:   98.1 ?F (36.7 ?C) 98.6 ?F (37 ?C)  ?TempSrc:   Oral   ?SpO2: 98% 96%  98%  ?Weight:      ?Height:      ? ?General exam: Appears calm and comfortable ?Respiratory system: Clear to auscultation. Respiratory effort normal. ?Cardiovascular system: S1 & S2 heard, RRR. No murmurs, rubs, gallops or clicks. ?Gastrointestinal system: Abdomen is nondistended, soft and tender in suprapubic/RLQ. Normal bowel sounds heard. ?Central nervous system: Alert and oriented. No focal neurological deficits. ?Skin: No cyanosis. No rashes ?Psychiatry: Judgement and insight appear normal. Mood & affect appropriate.  ? ?Data  Reviewed: ? ?CBC significant for WBC of 12,000 ?CMP significant for ALT of 46 and total bilirubin of 2 ? ?Assessment and Plan: ?* Diverticulitis ?Sigmoid region. First episode. Patient was on Ciprofloxacin as an outpatient for presumed UTI. Discussed with general surgery who recommend Ciprofloxacin and Flagyl combination. ?-General surgery: Soft diet, IV antibiotics ?-Ciprofloxacin 400 mg IV BID and Flagyl 500 mg IV BID ?-Watch for worsening symptoms ? ?Non-alcoholic fatty liver disease ?Patient follows with GI as an outpatient. Mildly elevated ALT and bilirubin. Last known baseline from 2019 and unlikely accurate. Asymptomatic. ? ? ? ? Advance Care Planning:   Code  Status: Full Code  ? ?Consults: General surgery ? ?Family Communication: Wife at bedside ? ? ?Author: ?Jacquelin Hawking, MD ?09/08/2021 1:58 PM ? ?For on call review [email or web address].  ?

## 2021-09-08 NOTE — ED Provider Notes (Signed)
7:17 AM ?Patient signed out to me by previous ED physician. Patient is a 56 yo male presenting for suprapubic abdominal pain.  ? ?Workup:  ?Not retaining urine ?UA stable ?CT abdomen pending with suspicion for diverticulitis.  ? ? ?Physical Exam  ?BP 128/81   Pulse 86   Temp 98.3 ?F (36.8 ?C) (Oral)   Resp 18   Ht 5\' 9"  (1.753 m)   Wt 102.1 kg   SpO2 96%   BMI 33.23 kg/m?  ? ?Physical Exam ?Vitals and nursing note reviewed.  ?Constitutional:   ?   General: He is not in acute distress. ?   Appearance: He is well-developed.  ?HENT:  ?   Head: Normocephalic and atraumatic.  ?Eyes:  ?   Conjunctiva/sclera: Conjunctivae normal.  ?Cardiovascular:  ?   Rate and Rhythm: Normal rate and regular rhythm.  ?   Heart sounds: No murmur heard. ?Pulmonary:  ?   Effort: Pulmonary effort is normal. No respiratory distress.  ?   Breath sounds: Normal breath sounds.  ?Abdominal:  ?   Palpations: Abdomen is soft.  ?   Tenderness: There is generalized abdominal tenderness.  ?Musculoskeletal:     ?   General: No swelling.  ?   Cervical back: Neck supple.  ?Skin: ?   General: Skin is warm and dry.  ?   Capillary Refill: Capillary refill takes less than 2 seconds.  ?Neurological:  ?   Mental Status: He is alert.  ?Psychiatric:     ?   Mood and Affect: Mood normal.  ? ? ?Procedures  ?Procedures ? ?ED Course / MDM  ?  ?Medical Decision Making ?Amount and/or Complexity of Data Reviewed ?Labs: ordered. ?Radiology: ordered. ? ?Risk ?Prescription drug management. ?Decision regarding hospitalization. ? ?9:24 AM ?CT study demonstrates signs of diverticulitis of the sigmoid colon with mirco-perforation.  I spoke with general surgery on-call who recommends observation and IV antibiotics.  Imaging also demonstrates some irregularity radiologist recommendations for colonoscopy.  Patient has history of precancerous polyps colonoscopy over the last 9 years has been followed closely by GI specialist Dr. .  Was scheduled for colonoscopy  this week however canceled due to current abdominal pain thinking he might of had a repeat kidney stone.  Recommended for close follow-up after resolution of diverticulitis.I spoke with on call general surgery team who recommends observation for serial abdominal exams and IV antibiotics. Hospitalist Dr. Rozetta Nunnery accepts patient.  ? ?  ?Ronaldo Miyamoto, DO ?09/10/21 [phone] ? ?

## 2021-09-08 NOTE — ED Provider Notes (Signed)
? ?[facility] EMERGENCY DEPARTMENT  ?Provider Note ? ?CSN: [id number] ?Arrival date & time: 09/08/21 1856 ? ?History ?Chief Complaint  ?Patient presents with  ? Abdominal Pain  ? ? ?FABIAN WALDER is a 56 y.o. male reports about a week of lower abdominal discomfort, associated with a feeling of incomplete bladder emptying, some pain at the urethral meatus when at the end of urination. He had some flank pain last week as well. He has had several days of progressive lower abdominal pain now, worse with mvoement and difficulty sleeping during the night. No vomiting or fever. He had a colonoscopy in Oct 2021 with several polyps as well as diverticulosis per outside GI records. He was seen in his GI office last month for an episode of hematochezia and scheduled for another colonoscopy yesterday but that was rescheduled due to his pain. He saw [facility] and was given Rx for Abx and Flomax for his urinary symptoms but reports he was told his urine specimen was clear without infection or blood. He denies any trouble with his bowels in recent days.  ? ? ?Home Medications ?Prior to Admission medications   ?Medication Sig Start Date End Date Taking? Authorizing Provider  ?calcium carbonate (TUMS EX) 750 MG chewable tablet Chew 2 tablets by mouth daily as needed. For indigestion     [provider]  ?omeprazole (PRILOSEC) 20 MG capsule Take 1 capsule (20 mg total) by mouth 2 (two) times daily. 07/27/17   Rolland Porter, MD  ?oseltamivir (TAMIFLU) 75 MG capsule Take 1 capsule (75 mg total) by mouth every 12 (twelve) hours. 04/04/21   Eustace Moore, MD  ?sucralfate (CARAFATE) 1 g tablet Take 1 tablet (1 g total) by mouth 4 (four) times daily. 07/27/17   Rolland Porter, MD  ? ? ? ?Allergies    ?Bee venom ? ? ?Review of Systems   ?Review of Systems ?Please see HPI for pertinent positives and negatives ? ?Physical Exam ?BP 133/87   Pulse 85   Temp 98.3 ?F (36.8 ?C) (Oral)   Resp 18   Ht 5\' 9"  (1.753 m)   Wt  102.1 kg   SpO2 95%   BMI 33.23 kg/m?  ? ?Physical Exam ?Vitals and nursing note reviewed.  ?Constitutional:   ?   Appearance: Normal appearance.  ?HENT:  ?   Head: Normocephalic and atraumatic.  ?   Nose: Nose normal.  ?   Mouth/Throat:  ?   Mouth: Mucous membranes are moist.  ?Eyes:  ?   Extraocular Movements: Extraocular movements intact.  ?   Conjunctiva/sclera: Conjunctivae normal.  ?Cardiovascular:  ?   Rate and Rhythm: Normal rate.  ?Pulmonary:  ?   Effort: Pulmonary effort is normal.  ?   Breath sounds: Normal breath sounds.  ?Abdominal:  ?   General: Abdomen is flat.  ?   Palpations: Abdomen is soft.  ?   Tenderness: There is abdominal tenderness (diffuse lower, L>R). There is guarding (suprapubic and LLQ).  ?Musculoskeletal:     ?   General: No swelling. Normal range of motion.  ?   Cervical back: Neck supple.  ?Skin: ?   General: Skin is warm and dry.  ?Neurological:  ?   General: No focal deficit present.  ?   Mental Status: He is alert.  ?Psychiatric:     ?   Mood and Affect: Mood normal.  ? ? ?ED Results / Procedures / Treatments   ?EKG ?None ? ?Procedures ?Procedures ? ?Medications  Ordered in the ED ?Medications - No data to display ? ?Initial Impression and Plan ? Patient here with lower abdominal pain, worsening for several days. Having some urinary symptoms but a post-void residual here is 82mL. I suspect he has either acute diverticulitis or an appendicitis with an unusual location. Less likely UTI, kidney stone or prostatitis. Will check his labs and send for CT. Patient declines pain medications at this time. Care of the patient will be signed out to the oncoming team and the change of shift.  ? ?ED Course  ? ?  ? ? ?MDM Rules/Calculators/A&P ?Medical Decision Making ?Amount and/or Complexity of Data Reviewed ?Labs: ordered. ?Radiology: ordered. ? ? ? ?Final Clinical Impression(s) / ED Diagnoses ?Final diagnoses:  ?Lower abdominal pain  ? ? ?Rx / DC Orders ?ED Discharge Orders   ? ? None  ? ?   ? ?  ?Pollyann Savoy, MD ?09/08/21 [phone] ? ?

## 2021-09-08 NOTE — ED Notes (Signed)
Called [facility] in [place] for Dr. Ellan Lambert ? ? ? ? ? ? ? ? ? ? ? ? ? ? ?

## 2021-09-08 NOTE — ED Triage Notes (Addendum)
Pt c/o suprapubic pain x 1 week. Pt was seen by MD and given abx and flowmax for possible kidney stone. Pt reports pain has worsened and is more consistent ans feels like his bladder is not fully emptying.  ?

## 2021-09-08 NOTE — ED Notes (Signed)
Patient transported to CT 

## 2021-09-08 NOTE — ED Notes (Signed)
Post void residual bladder scan = 0

## 2021-09-08 NOTE — ED Notes (Signed)
Report given to [facility]. 

## 2021-09-08 NOTE — Consult Note (Addendum)
? ?CC: Diverticulitis ? ?HPI: ?Johnathan White is an 56 y.o. male hx HTN, obesity, GERD, fatty liver, hemorrhoids presents to our facility after undergoing workup at [facility]. Presented there with ~1 wk of progressive lower abdominal discomfort/pain. Radiates to flank some. No fevers, nausea or vomiting. No blood in stool. Was concerned he was passing a kidney stone. Reports he had negative urine analysis in the last week with his PCP. ? ?[facility], polyps per report.  ? ?Past Medical History:  ?Diagnosis Date  ? Back pain, chronic   ? Chronic heartburn   ? Hypertension   ? Spontaneous pneumothorax   ? as a child  ? ? ?Past Surgical History:  ?Procedure Laterality Date  ? WISDOM TOOTH EXTRACTION    ? ? ?Family History  ?Problem Relation Age of Onset  ? Hypertension Father   ? Diabetes Maternal Aunt   ? Diabetes Maternal Grandmother   ? Sudden death Paternal Grandfather   ? Hyperlipidemia Neg Hx   ? Heart attack Neg Hx   ? Cancer Mother   ?     breast  ? Mitral valve prolapse Mother   ? Cancer Sister   ?     breast  ? ? ?Social:  reports that he has never smoked. He has never used smokeless tobacco. He reports that he does not drink alcohol and does not use drugs. ? ?Allergies:  ?Allergies  ?Allergen Reactions  ? Bee Venom   ? ? ?Medications: I have reviewed the patient's current medications. ? ?Results for orders placed or performed during the hospital encounter of 09/08/21 (from the past 48 hour(s))  ?Urinalysis, Routine w reflex microscopic Urine, Clean Catch     Status: Abnormal  ? Collection Time: 09/08/21  6:40 AM  ?Result Value Ref Range  ? Color, Urine YELLOW YELLOW  ? APPearance CLEAR CLEAR  ? Specific Gravity, Urine 1.025 1.005 - 1.030  ? pH 5.5 5.0 - 8.0  ? Glucose, UA NEGATIVE NEGATIVE mg/dL  ? Hgb urine dipstick TRACE (A) NEGATIVE  ? Bilirubin Urine NEGATIVE NEGATIVE  ? Ketones, ur NEGATIVE NEGATIVE mg/dL  ? Protein, ur NEGATIVE NEGATIVE mg/dL  ? Nitrite NEGATIVE NEGATIVE  ?  Leukocytes,Ua NEGATIVE NEGATIVE  ?  Comment: Performed at [facility] [place], [address] [place], [place] [address]  ?Urinalysis, Microscopic (reflex)     Status: Abnormal  ? Collection Time: 09/08/21  6:40 AM  ?Result Value Ref Range  ? RBC / HPF 0-5 0 - 5 RBC/hpf  ? WBC, UA 0-5 0 - 5 WBC/hpf  ? Bacteria, UA RARE (A) NONE SEEN  ? Squamous Epithelial / LPF 0-5 0 - 5  ? Mucus PRESENT   ?  Comment: Performed at [facility] [place], [address] [place], [place] [address]  ?Comprehensive metabolic panel     Status: Abnormal  ? Collection Time: 09/08/21  6:45 AM  ?Result Value Ref Range  ? Sodium 137 135 - 145 mmol/L  ? Potassium 3.5 3.5 - 5.1 mmol/L  ? Chloride 105 98 - 111 mmol/L  ? CO2 24 22 - 32 mmol/L  ? Glucose, Bld 113 (H) 70 - 99 mg/dL  ?  Comment: Glucose reference range applies only to samples taken after fasting for at least 8 hours.  ? BUN 12 6 - 20 mg/dL  ? Creatinine, Ser 1.07 0.61 - 1.24 mg/dL  ? Calcium 8.7 (L) 8.9 - 10.3 mg/dL  ? Total Protein 7.1  6.5 - 8.1 g/dL  ? Albumin 4.1 3.5 - 5.0 g/dL  ? AST 30 15 - 41 U/L  ? ALT 46 (H) 0 - 44 U/L  ? Alkaline Phosphatase 52 38 - 126 U/L  ? Total Bilirubin 2.0 (H) 0.3 - 1.2 mg/dL  ? GFR, Estimated >60 >60 mL/min  ?  Comment: (NOTE) ?Calculated using the CKD-EPI Creatinine Equation (2021) ?  ? Anion gap 8 5 - 15  ?  Comment: Performed at [facility], [address] [place], [place] [address]  ?CBC with Differential     Status: Abnormal  ? Collection Time: 09/08/21  6:45 AM  ?Result Value Ref Range  ? WBC 12.0 (H) 4.0 - 10.5 K/uL  ? RBC 4.64 4.22 - 5.81 MIL/uL  ? Hemoglobin 15.2 13.0 - 17.0 g/dL  ? HCT 42.5 39.0 - 52.0 %  ? MCV 91.6 80.0 - 100.0 fL  ? MCH 32.8 26.0 - 34.0 pg  ? MCHC 35.8 30.0 - 36.0 g/dL  ? RDW 12.4 11.5 - 15.5 %  ? Platelets 231 150 - 400 K/uL  ? nRBC 0.0 0.0 - 0.2 %  ? Neutrophils Relative % 73 %  ? Neutro Abs 8.8 (H) 1.7 - 7.7 K/uL  ? Lymphocytes Relative 14 %  ? Lymphs Abs 1.7 0.7 - 4.0 K/uL  ? Monocytes Relative 10  %  ? Monocytes Absolute 1.2 (H) 0.1 - 1.0 K/uL  ? Eosinophils Relative 2 %  ? Eosinophils Absolute 0.2 0.0 - 0.5 K/uL  ? Basophils Relative 1 %  ? Basophils Absolute 0.1 0.0 - 0.1 K/uL  ? Immature Granulocytes 0 %  ? Abs Immature Granulocytes 0.05 0.00 - 0.07 K/uL  ?  Comment: Performed at [facility], [address], [place], [place] [address]  ? ? ?CT Abdomen Pelvis W Contrast ? ?Result Date: 09/08/2021 ?CLINICAL DATA:  LLQ abdominal pain EXAM: CT ABDOMEN AND PELVIS WITH CONTRAST TECHNIQUE: Multidetector CT imaging of the abdomen and pelvis was performed using the standard protocol following bolus administration of intravenous contrast. RADIATION DOSE REDUCTION: This exam was performed according to the departmental dose-optimization program which includes automated exposure control, adjustment of the mA and/or kV according to patient size and/or use of iterative reconstruction technique. CONTRAST:  OMNIPAQUE IOHEXOL 300 MG/ML  SOLN COMPARISON:  CT AP, most recently 02/12/2015 and 10/15/2012. Chest XR, 07/27/2017. FINDINGS: Lower chest: No acute abnormality. Hepatobiliary: Hypodense liver. No focal liver abnormality is seen. No gallstones, gallbladder wall thickening, or biliary dilatation. Pancreas: No pancreatic ductal dilatation or surrounding inflammatory changes. Spleen: Normal in size without focal abnormality. Adrenals/Urinary Tract: Adrenal glands are unremarkable. Punctate (sub-3 mm) LEFT inferior renal collecting system nephrolith. Kidneys are otherwise normal, without focal lesion, or hydronephrosis. Bladder is unremarkable. Stomach/Bowel: Stomach is within normal limits. Appendix appears normal. Nonobstructed small bowel. Nondilated colon. A mild burden of sigmoid diverticulosis is present. Mid sigmoid mucosal wall thickening pericolonic stranding and small foci of pericolonic gas. See key image. No focal drainable collection or abscess. Vascular/Lymphatic: No significant vascular  findings are present. No enlarged abdominal or pelvic lymph nodes. Reproductive: Prostate is unremarkable. Mild engorgement of the seminal vesicles. Other: Small fat-containing RIGHT inguinal hernia versus cord lipoma. No abdominopelvic ascites. Musculoskeletal: No acute osseous findings. IMPRESSION: 1. Mid sigmoid thickening, stranding and trace pericolonic gas, consistent with micro-perforation. Findings likely to represent acute, uncomplicated sigmoid diverticulitis. 2. No focal drainable collection or abscess. Attention: A mild burden of diverticulosis is present  and somewhat incongruent. Recommend repeat imaging and referral for direct visualization after treatment and resolution to rule out underlying mass. Electronically Signed   By: Roanna Banning M.D.   On: 09/08/2021 08:17   ? ?ROS - all of the below systems have been reviewed with the patient and positives are indicated with bold text ?General: chills, fever or night sweats ?Eyes: blurry vision or double vision ?ENT: epistaxis or sore throat ?Allergy/Immunology: itchy/watery eyes or nasal congestion ?Hematologic/Lymphatic: bleeding problems, blood clots or swollen lymph nodes ?Endocrine: temperature intolerance or unexpected weight changes ?Breast: new or changing breast lumps or nipple discharge ?Resp: cough, shortness of breath, or wheezing ?CV: chest pain or dyspnea on exertion ?GI: as per HPI ?GU: dysuria, trouble voiding, or hematuria ?MSK: joint pain or joint stiffness ?Neuro: TIA or stroke symptoms ?Derm: pruritus and skin lesion changes ?Psych: anxiety and depression ? ?PE ?Blood pressure 119/82, pulse 72, temperature 98.1 ?F (36.7 ?C), temperature source Oral, resp. rate 16, height 5\' 9"  (1.753 m), weight 102.1 kg, SpO2 96 %. ?Constitutional: NAD; conversant; no deformities ?Eyes: Moist conjunctiva; no lid lag; anicteric; PERRL ?Neck: Trachea midline; no thyromegaly ?Lungs: Normal respiratory effort; no tactile fremitus ?CV: RRR; no palpable  thrills; no pitting edema ?GI: Abd soft, mildly ttp in LLQ; no palpable hepatosplenomegaly ?MSK: Normal range of motion of extremities; no clubbing/cyanosis ?Psychiatric: Appropriate affect; alert and oriented x3 ?

## 2021-09-08 NOTE — Progress Notes (Signed)
Plan of Care Note for accepted transfer ? ? ?Patient: Johnathan White MRN: [id number]   DOA: 09/08/2021 ? ?Facility requesting transfer: [facility] ?Requesting Provider: Dr. Shawna Orleans ?Reason for transfer: Diverticulitis ?Facility course: 56 yo presenting with abdominal pain. Found to have uncomplicated diverticulitis. Started on ancef per general surgery recommendation. They will follow once he arrives to [facility]  ? ?Plan of care: ?The patient is accepted for admission to [place]  unit, at [facility]..  ?While holding at [facility], medical decision making for this patient remains the responsibility of the EDP. Upon the arrival to [facility], [facility] will assume care.  ? ?Author: ?Jonnie Finner, DO ?09/08/2021 ? ?Check [email or web address] for on-call coverage. ? ?Nursing staff, Please call [place] number on Amion as soon as patient's arrival, so appropriate admitting provider can evaluate the pt. ? ?

## 2021-09-08 NOTE — Plan of Care (Signed)
Pt transferred to [facility] from [facility]  ?

## 2021-09-08 NOTE — ED Notes (Signed)
Cristy RN given report on 3E at [facility] ?

## 2021-09-09 DIAGNOSIS — K5792 Diverticulitis of intestine, part unspecified, without perforation or abscess without bleeding: Secondary | ICD-10-CM | POA: Diagnosis not present

## 2021-09-09 DIAGNOSIS — K760 Fatty (change of) liver, not elsewhere classified: Secondary | ICD-10-CM | POA: Diagnosis not present

## 2021-09-09 LAB — CBC
HCT: 43.7 % (ref 39.0–52.0)
Hemoglobin: 14.8 g/dL (ref 13.0–17.0)
MCH: 32.2 pg (ref 26.0–34.0)
MCHC: 33.9 g/dL (ref 30.0–36.0)
MCV: 95 fL (ref 80.0–100.0)
Platelets: 220 10*3/uL (ref 150–400)
RBC: 4.6 MIL/uL (ref 4.22–5.81)
RDW: 12.4 % (ref 11.5–15.5)
WBC: 8.7 10*3/uL (ref 4.0–10.5)
nRBC: 0 % (ref 0.0–0.2)

## 2021-09-09 LAB — COMPREHENSIVE METABOLIC PANEL
ALT: 34 U/L (ref 0–44)
AST: 22 U/L (ref 15–41)
Albumin: 3.7 g/dL (ref 3.5–5.0)
Alkaline Phosphatase: 48 U/L (ref 38–126)
Anion gap: 4 — ABNORMAL LOW (ref 5–15)
BUN: 18 mg/dL (ref 6–20)
CO2: 28 mmol/L (ref 22–32)
Calcium: 8.5 mg/dL — ABNORMAL LOW (ref 8.9–10.3)
Chloride: 105 mmol/L (ref 98–111)
Creatinine, Ser: 0.94 mg/dL (ref 0.61–1.24)
GFR, Estimated: >60 mL/min (ref >60)
Glucose, Bld: 110 mg/dL — ABNORMAL HIGH (ref 70–99)
Potassium: 3.8 mmol/L (ref 3.5–5.1)
Sodium: 137 mmol/L (ref 135–145)
Total Bilirubin: 1.4 mg/dL — ABNORMAL HIGH (ref 0.3–1.2)
Total Protein: 6.5 g/dL (ref 6.5–8.1)

## 2021-09-09 LAB — HIV ANTIBODY (ROUTINE TESTING W REFLEX): HIV Screen 4th Generation wRfx: NONREACTIVE

## 2021-09-09 MED ORDER — OXYCODONE HCL 5 MG PO TABS: 5.0000 mg | ORAL_TABLET | Freq: Four times a day (QID) | ORAL | 0 refills | Status: AC | PRN

## 2021-09-09 MED ORDER — AMOXICILLIN-POT CLAVULANATE 875-125 MG PO TABS: 1.0000 | ORAL_TABLET | Freq: Two times a day (BID) | ORAL | 0 refills | Status: AC

## 2021-09-09 MED ORDER — AMOXICILLIN-POT CLAVULANATE 875-125 MG PO TABS
1.0000 | ORAL_TABLET | Freq: Two times a day (BID) | ORAL | Status: DC
  Administered 2021-09-09: 1 via ORAL
  Filled 2021-09-09: qty 1

## 2021-09-09 NOTE — Progress Notes (Signed)
? ?Subjective/Chief Complaint: ?Feels much better. No abd pain ? ? ?Objective: ?Vital signs in last 24 hours: ?Temp:  [97.6 ?F (36.4 ?C)-98.8 ?F (37.1 ?C)] 97.6 ?F (36.4 ?C) (04/01 0554) ?Pulse Rate:  [60-86] 60 (04/01 0554) ?Resp:  [16-18] 18 (04/01 0554) ?BP: (109-145)/(66-90) 145/73 (04/01 0554) ?SpO2:  [93 %-98 %] 94 % (04/01 0554) ?Last BM Date : 09/06/21 ? ?Intake/Output from previous day: ?03/31 0701 - 04/01 0700 ?In: 600.1 [P.O.:600; I.V.:0.1] ?Out: -  ?Intake/Output this shift: ?Total I/O ?In: 120 [P.O.:120] ?Out: -  ? ?General appearance: alert and cooperative ?Resp: clear to auscultation bilaterally ?Cardio: regular rate and rhythm ?GI: soft, nontender ? ?Lab Results:  ?Recent Labs  ?  09/08/21 ?0645 09/09/21 ?0405  ?WBC 12.0* 8.7  ?HGB 15.2 14.8  ?HCT 42.5 43.7  ?PLT 231 220  ? ?BMET ?Recent Labs  ?  09/08/21 ?0645 09/09/21 ?0405  ?NA 137 137  ?K 3.5 3.8  ?CL 105 105  ?CO2 24 28  ?GLUCOSE 113* 110*  ?BUN 12 18  ?CREATININE 1.07 0.94  ?CALCIUM 8.7* 8.5*  ? ?PT/INR ?No results for input(s): LABPROT, INR in the last 72 hours. ?ABG ?No results for input(s): PHART, HCO3 in the last 72 hours. ? ?Invalid input(s): PCO2, PO2 ? ?Studies/Results: ?CT Abdomen Pelvis W Contrast ? ?Result Date: 09/08/2021 ?CLINICAL DATA:  LLQ abdominal pain EXAM: CT ABDOMEN AND PELVIS WITH CONTRAST TECHNIQUE: Multidetector CT imaging of the abdomen and pelvis was performed using the standard protocol following bolus administration of intravenous contrast. RADIATION DOSE REDUCTION: This exam was performed according to the departmental dose-optimization program which includes automated exposure control, adjustment of the mA and/or kV according to patient size and/or use of iterative reconstruction technique. CONTRAST:  OMNIPAQUE IOHEXOL 300 MG/ML  SOLN COMPARISON:  CT AP, most recently 02/12/2015 and 10/15/2012. Chest XR, 07/27/2017. FINDINGS: Lower chest: No acute abnormality. Hepatobiliary: Hypodense liver. No focal liver  abnormality is seen. No gallstones, gallbladder wall thickening, or biliary dilatation. Pancreas: No pancreatic ductal dilatation or surrounding inflammatory changes. Spleen: Normal in size without focal abnormality. Adrenals/Urinary Tract: Adrenal glands are unremarkable. Punctate (sub-3 mm) LEFT inferior renal collecting system nephrolith. Kidneys are otherwise normal, without focal lesion, or hydronephrosis. Bladder is unremarkable. Stomach/Bowel: Stomach is within normal limits. Appendix appears normal. Nonobstructed small bowel. Nondilated colon. A mild burden of sigmoid diverticulosis is present. Mid sigmoid mucosal wall thickening pericolonic stranding and small foci of pericolonic gas. See key image. No focal drainable collection or abscess. Vascular/Lymphatic: No significant vascular findings are present. No enlarged abdominal or pelvic lymph nodes. Reproductive: Prostate is unremarkable. Mild engorgement of the seminal vesicles. Other: Small fat-containing RIGHT inguinal hernia versus cord lipoma. No abdominopelvic ascites. Musculoskeletal: No acute osseous findings. IMPRESSION: 1. Mid sigmoid thickening, stranding and trace pericolonic gas, consistent with micro-perforation. Findings likely to represent acute, uncomplicated sigmoid diverticulitis. 2. No focal drainable collection or abscess. Attention: A mild burden of diverticulosis is present and somewhat incongruent. Recommend repeat imaging and referral for direct visualization after treatment and resolution to rule out underlying mass. Electronically Signed   By: Roanna Banning M.D.   On: 09/08/2021 08:17   ? ?Anti-infectives: ?Anti-infectives (From admission, onward)  ? ? Start     Dose/Rate Route Frequency Ordered Stop  ? 09/09/21 1000  amoxicillin-clavulanate (AUGMENTIN) 875-125 MG per tablet 1 tablet       ? 1 tablet Oral Every 12 hours 09/09/21 0629    ? 09/08/21 1600  cefTRIAXone (ROCEPHIN) 2 g in sodium  chloride 0.9 % 100 mL IVPB  Status:   Discontinued       ? 2 g ?200 mL/hr over 30 Minutes Intravenous Daily 09/08/21 1549 09/09/21 0629  ? 09/08/21 1530  metroNIDAZOLE (FLAGYL) IVPB 500 mg  Status:  Discontinued       ? 500 mg ?100 mL/hr over 60 Minutes Intravenous 2 times daily 09/08/21 1442 09/09/21 0629  ? 09/08/21 1530  ciprofloxacin (CIPRO) IVPB 400 mg  Status:  Discontinued       ? 400 mg ?200 mL/hr over 60 Minutes Intravenous 2 times daily 09/08/21 1442 09/08/21 1549  ? 09/08/21 1000  ceFAZolin (ANCEF) IVPB 1 g/50 mL premix       ? 1 g ?100 mL/hr over 30 Minutes Intravenous  Once 09/08/21 0954 09/08/21 1103  ? ?  ? ? ?Assessment/Plan: ?s/p * No surgery found * ?Advance diet ?Switch to oral abx ?If he tolerates this then he may be able to go home later today or tomorrow am ?Will need to follow up with Dr. Cliffton Asters after d/c ?Diverticulitis uncomplicated ? ? LOS: 0 days  ? ? ?Johnathan White ?09/09/2021 ? ?

## 2021-09-09 NOTE — Discharge Instructions (Signed)
Johnathan White, ? ?You were in the hospital because of an infection in your intestines called diverticulitis. You have improved with antibiotics. Please continue antibiotics as prescribed and follow-up with your primary care physician. Please start a low fiber diet (information provided) for the next few weeks prior to resuming your previous higher fiber diet. Please use your Miralax and drink fluids to help with your constipation issues. It was a pleasure meeting/taking care of you and meeting your wife.  ? ?Sincerely, ? ?Cordelia Poche, MD ? ?

## 2021-09-09 NOTE — Progress Notes (Signed)
Reviewed written d/c instructions w pt and his wife and all questions answered. They both verbalized understanding. D/C via w/c w all belongings in stable condition. 

## 2021-09-09 NOTE — Discharge Summary (Signed)
?Physician Discharge Summary ?  ?Patient: Johnathan White MRN: [id number] DOB: 1965-11-08  ?Admit date:     09/08/2021  ?Discharge date: 09/09/21  ?Discharge Physician: Jacquelin Hawking, MD  ? ?PCP: Sigmund Hazel, MD  ? ?Recommendations at discharge:  ? ?PCP follow-up ?Repeat CMP ? ?Discharge Diagnoses: ?Principal Problem: ?  Diverticulitis ?Active Problems: ?  Non-alcoholic fatty liver disease ? ?Resolved Problems: ?  * No resolved hospital problems. * ? ? ?Assessment and Plan: ?* Diverticulitis ?Sigmoid region. First episode. Patient was on Ciprofloxacin as an outpatient for presumed UTI. Discussed with general surgery who initially recommend Ciprofloxacin and Flagyl combination, but switched to Ceftriaxone/Flagyl. Patient with improved symptoms. General surgery transitioned to Augmentin for discharge. Patient discharged to complete a 14 day course of antibiotics. ? ?Non-alcoholic fatty liver disease ?Patient follows with GI as an outpatient. Mildly elevated ALT and bilirubin. Last known baseline from 2019 and unlikely accurate. Asymptomatic. Improved on day of discharge. ? ? ? ? ?  ? ? ?Consultants: General surgery ?Procedures performed: None  ?Disposition: Home ?Diet recommendation: Soft diet, low fiber ? ?DISCHARGE MEDICATION: ?Allergies as of 09/09/2021   ? ?   Reactions  ? Bee Venom Hives, Swelling, Other (See Comments)  ? Site stung swells and develops large welts. Patient also felt "tension" in his throat.  ? ?  ? ?  ?Medication List  ?  ? ?STOP taking these medications   ? ?Advil 200 MG tablet ?Generic drug: ibuprofen ?  ?ciprofloxacin 500 MG tablet ?Commonly known as: CIPRO ?  ?Citrucel oral powder ?Generic drug: methylcellulose ?  ?omeprazole 20 MG capsule ?Commonly known as: PRILOSEC ?  ?oseltamivir 75 MG capsule ?Commonly known as: TAMIFLU ?  ?sucralfate 1 g tablet ?Commonly known as: Carafate ?  ? ?  ? ?TAKE these medications   ? ?amoxicillin-clavulanate 875-125 MG tablet ?Commonly known as: AUGMENTIN ?Take 1  tablet by mouth 2 (two) times daily for 13 days. ?  ?Emergen-C Vitamin C Pack ?Take 1 packet by mouth daily as needed (to boost the immune system- mix with water before drinking). ?  ?Gaviscon Extra Strength 160-105 MG Chew ?Generic drug: Alum Hydroxide-Mag Carbonate ?Chew 2 tablets by mouth daily as needed (for indigestion). ?  ?MiraLax 17 GM/SCOOP powder ?Generic drug: polyethylene glycol powder ?Take 17 g by mouth daily. ?  ?omeprazole 20 MG tablet ?Commonly known as: PRILOSEC OTC ?Take 20 mg by mouth daily as needed (for heartburn). ?  ?oxyCODONE 5 MG immediate release tablet ?Commonly known as: Oxy IR/ROXICODONE ?Take 1 tablet (5 mg total) by mouth every 6 (six) hours as needed for up to 3 days for moderate pain or severe pain. ?  ?PRESCRIPTION MEDICATION ?CPAP- At bedtime ?  ?tamsulosin 0.4 MG Caps capsule ?Commonly known as: FLOMAX ?Take 0.4 mg by mouth every 12 (twelve) hours. ?  ?Zicam Cold Remedy Tbdp ?Take 1-2 tablets by mouth daily as needed (for cold-like symptoms). ?  ? ?  ? ? Follow-up Information   ? ? Sigmund Hazel, MD Follow up.   ?Specialty: Family Medicine ?Why: For hospital follow-up ?Contact information: ?[address] ?[place] [address] ?[phone] ? ? ?  ?  ? ?  ?  ? ?  ? ?Discharge Exam: ?Filed Weights  ? 09/08/21 0102  ?Weight: 102.1 kg  ? ?General exam: Appears calm and comfortable ?Respiratory system: Clear to auscultation. Respiratory effort normal. ?Cardiovascular system: S1 & S2 heard, RRR. No murmurs, rubs, gallops or clicks. ?Gastrointestinal system: Abdomen is nondistended, soft and  nontender. No organomegaly or masses felt. Normal bowel sounds heard. ?Central nervous system: Alert and oriented. No focal neurological deficits. ?Musculoskeletal: No edema. No calf tenderness ?Skin: No cyanosis. No rashes ?Psychiatry: Judgement and insight appear normal. Mood & affect appropriate.  ? ?Condition at discharge: stable ? ?The results of significant diagnostics from this  hospitalization (including imaging, microbiology, ancillary and laboratory) are listed below for reference.  ? ?Imaging Studies: ?CT Abdomen Pelvis W Contrast ? ?Result Date: 09/08/2021 ?CLINICAL DATA:  LLQ abdominal pain EXAM: CT ABDOMEN AND PELVIS WITH CONTRAST TECHNIQUE: Multidetector CT imaging of the abdomen and pelvis was performed using the standard protocol following bolus administration of intravenous contrast. RADIATION DOSE REDUCTION: This exam was performed according to the departmental dose-optimization program which includes automated exposure control, adjustment of the mA and/or kV according to patient size and/or use of iterative reconstruction technique. CONTRAST:  OMNIPAQUE IOHEXOL 300 MG/ML  SOLN COMPARISON:  CT AP, most recently 02/12/2015 and 10/15/2012. Chest XR, 07/27/2017. FINDINGS: Lower chest: No acute abnormality. Hepatobiliary: Hypodense liver. No focal liver abnormality is seen. No gallstones, gallbladder wall thickening, or biliary dilatation. Pancreas: No pancreatic ductal dilatation or surrounding inflammatory changes. Spleen: Normal in size without focal abnormality. Adrenals/Urinary Tract: Adrenal glands are unremarkable. Punctate (sub-3 mm) LEFT inferior renal collecting system nephrolith. Kidneys are otherwise normal, without focal lesion, or hydronephrosis. Bladder is unremarkable. Stomach/Bowel: Stomach is within normal limits. Appendix appears normal. Nonobstructed small bowel. Nondilated colon. A mild burden of sigmoid diverticulosis is present. Mid sigmoid mucosal wall thickening pericolonic stranding and small foci of pericolonic gas. See key image. No focal drainable collection or abscess. Vascular/Lymphatic: No significant vascular findings are present. No enlarged abdominal or pelvic lymph nodes. Reproductive: Prostate is unremarkable. Mild engorgement of the seminal vesicles. Other: Small fat-containing RIGHT inguinal hernia versus cord lipoma. No abdominopelvic  ascites. Musculoskeletal: No acute osseous findings. IMPRESSION: 1. Mid sigmoid thickening, stranding and trace pericolonic gas, consistent with micro-perforation. Findings likely to represent acute, uncomplicated sigmoid diverticulitis. 2. No focal drainable collection or abscess. Attention: A mild burden of diverticulosis is present and somewhat incongruent. Recommend repeat imaging and referral for direct visualization after treatment and resolution to rule out underlying mass. Electronically Signed   By: Roanna Banning M.D.   On: 09/08/2021 08:17   ? ?Microbiology: ?No results found for this or any previous visit. ? ?Labs: ?CBC: ?Recent Labs  ?Lab 09/08/21 ?0645 09/09/21 ?0405  ?WBC 12.0* 8.7  ?NEUTROABS 8.8*  --   ?HGB 15.2 14.8  ?HCT 42.5 43.7  ?MCV 91.6 95.0  ?PLT 231 220  ? ?Basic Metabolic Panel: ?Recent Labs  ?Lab 09/08/21 ?0645 09/09/21 ?0405  ?NA 137 137  ?K 3.5 3.8  ?CL 105 105  ?CO2 24 28  ?GLUCOSE 113* 110*  ?BUN 12 18  ?CREATININE 1.07 0.94  ?CALCIUM 8.7* 8.5*  ? ?Liver Function Tests: ?Recent Labs  ?Lab 09/08/21 ?0645 09/09/21 ?0405  ?AST 30 22  ?ALT 46* 34  ?ALKPHOS 52 48  ?BILITOT 2.0* 1.4*  ?PROT 7.1 6.5  ?ALBUMIN 4.1 3.7  ? ? ?Signed: ?Jacquelin Hawking, MD ?[facility] ?09/09/2021 ?

## 2022-02-09 ENCOUNTER — Other Ambulatory Visit: Payer: Self-pay | Admitting: Chiropractic Medicine

## 2022-02-09 ENCOUNTER — Ambulatory Visit
Admission: RE | Admit: 2022-02-09 | Discharge: 2022-02-09 | Disposition: A | Discharge status: Home / Self Care | Payer: 59 | Source: Ambulatory Visit | Attending: Chiropractic Medicine | Admitting: Chiropractic Medicine

## 2022-02-09 DIAGNOSIS — M25532 Pain in left wrist: Secondary | ICD-10-CM

## 2022-04-23 IMAGING — CT CT ABD-PELV W/ CM
2 of 5 series · 16 of 46 positions shown, 18 images · IV contrast (agent unspecified)
Comparison: CT AP, most recently 02/12/2015 and 10/15/2012. Chest
XR, 07/27/2017.

CLINICAL DATA: LLQ abdominal pain

EXAM:
CT ABDOMEN AND PELVIS WITH CONTRAST
TECHNIQUE: Multidetector CT imaging of the abdomen and pelvis was performed
using the standard protocol following bolus administration of
intravenous contrast.

[Series 2: axial st · axial · 0.89mm/px · z∈[+853,+1268]mm · 13 of 93 slices shown, 15 images]
[im 5/93  soft-tissue]
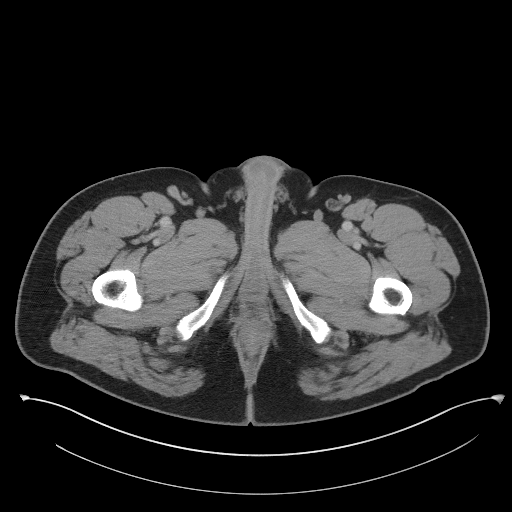
[im 5/93  bone]
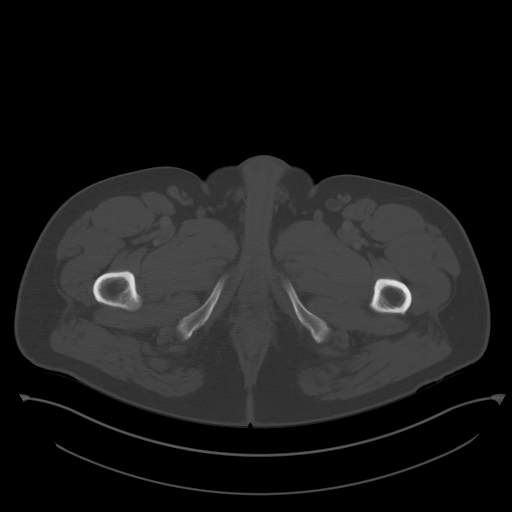
[im 15/93  soft-tissue]
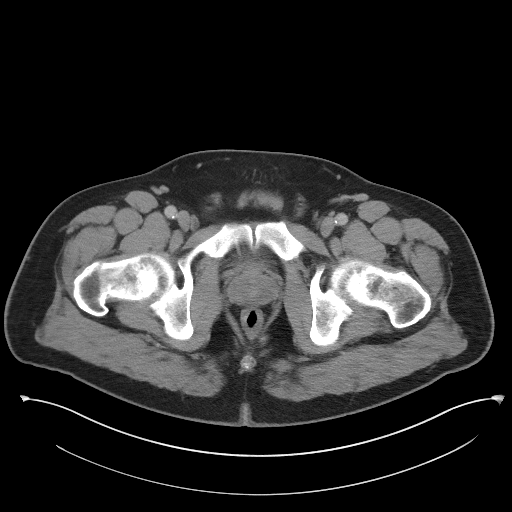
[im 20/93  soft-tissue]
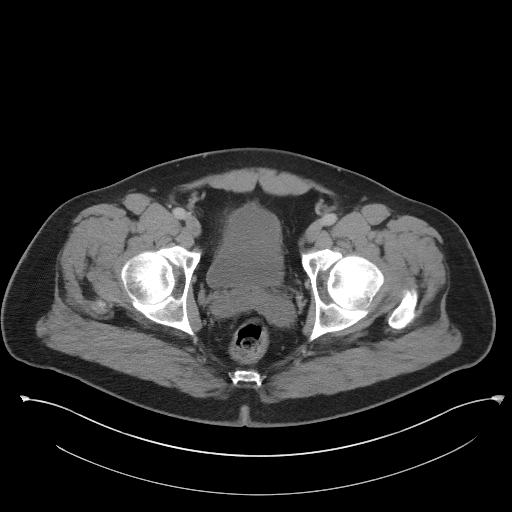
[im 25/93  soft-tissue]
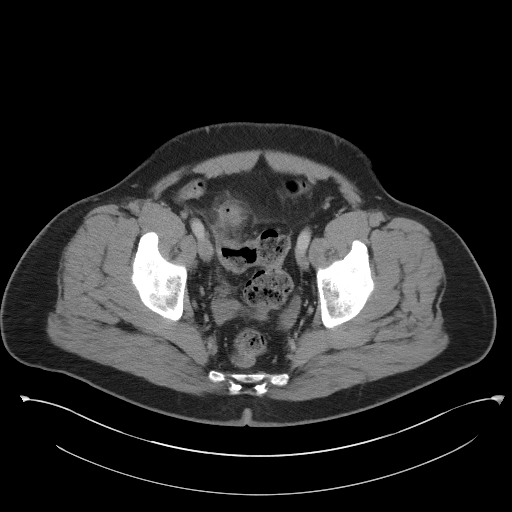
[im 34/93  soft-tissue]
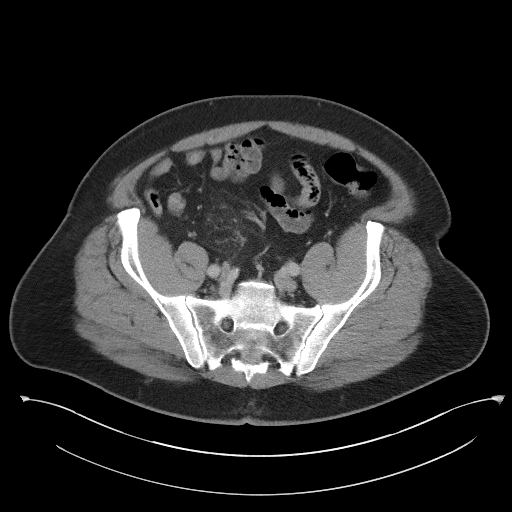
[im 39/93  soft-tissue]
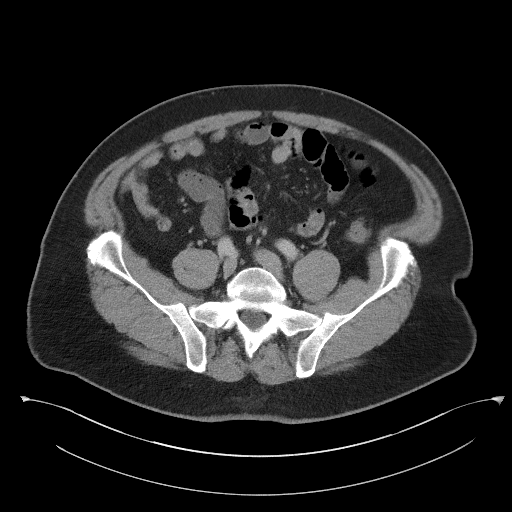
[im 49/93  soft-tissue]
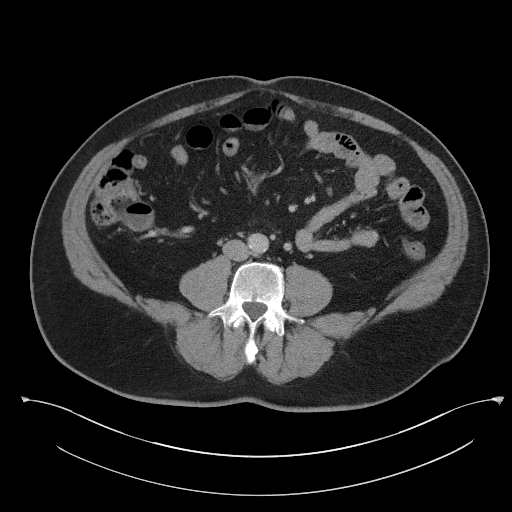
[im 54/93  soft-tissue]
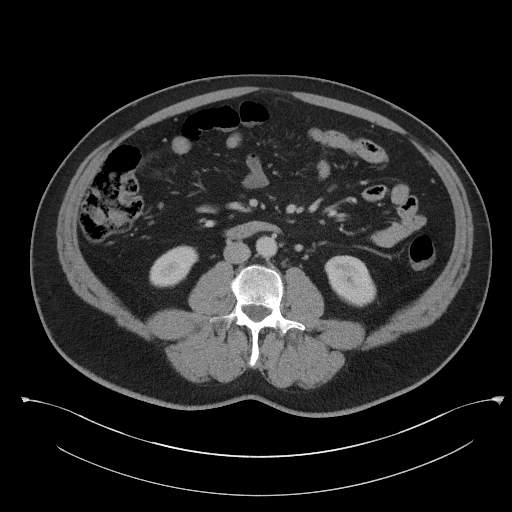
[im 59/93  soft-tissue]
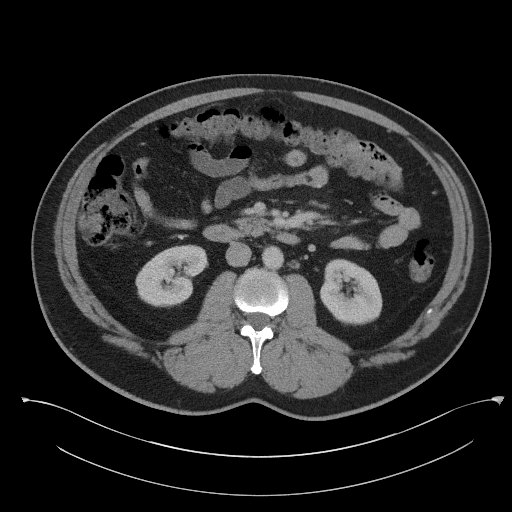
[im 59/93  bone]
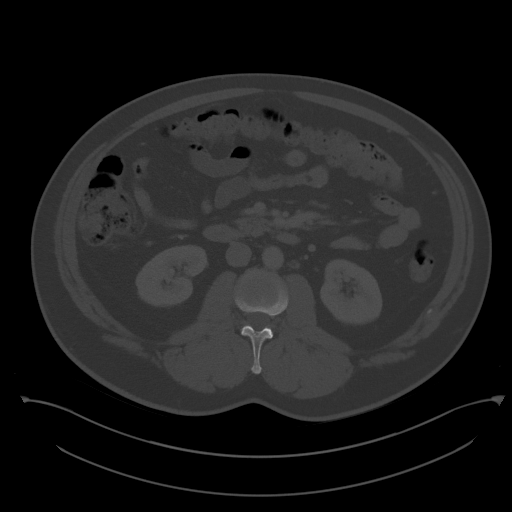
[im 68/93  soft-tissue]
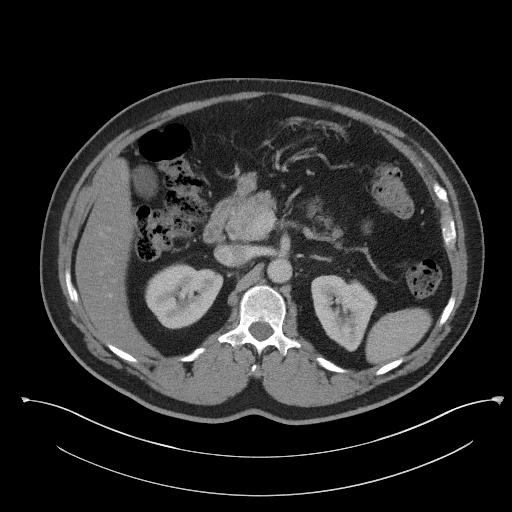
[im 73/93  soft-tissue]
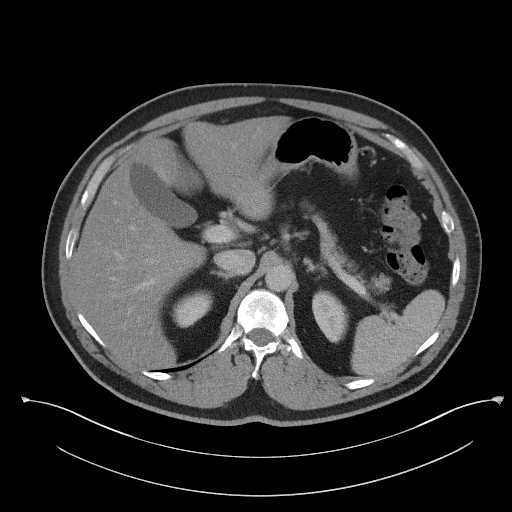
[im 78/93  soft-tissue]
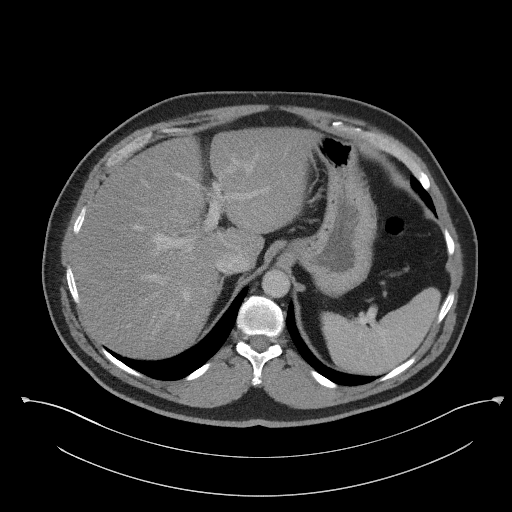
[im 88/93  soft-tissue]
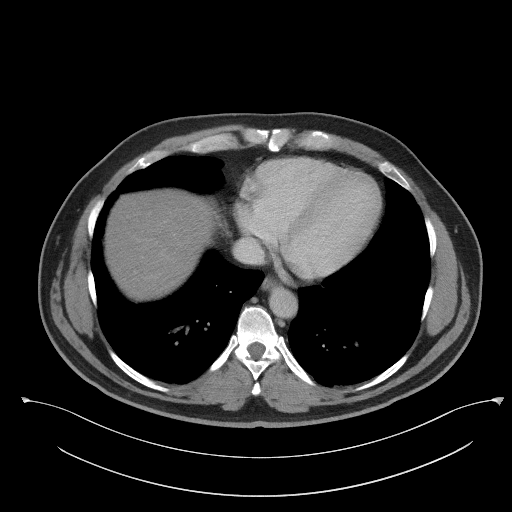

[Series 5: coronal st · coronal · 0.87mm/px · 3 of 104 slices shown]
[im 35/104  soft-tissue]
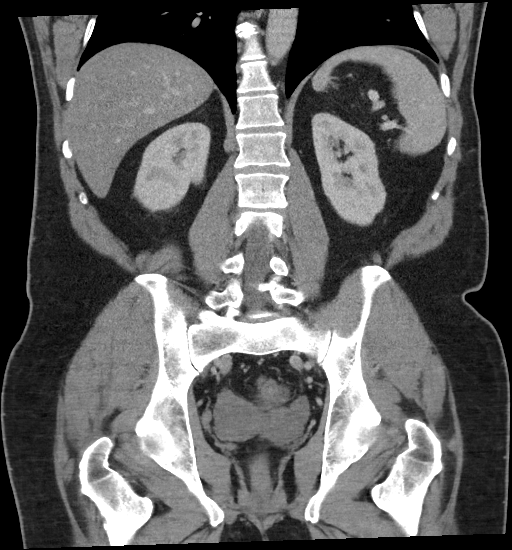
[im 46/104  soft-tissue]
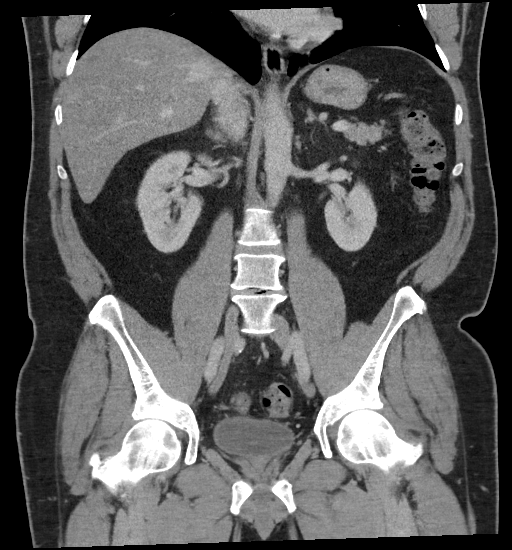
[im 58/104  soft-tissue]
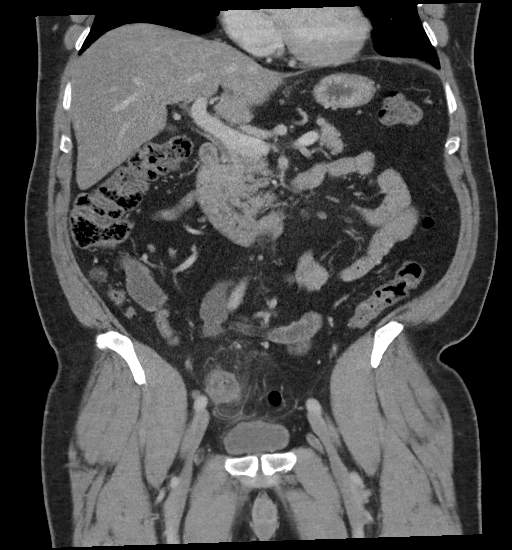

[16 of 46 positions shown; findings below may reference images not displayed]

RADIATION DOSE REDUCTION: This exam was performed according to the
departmental dose-optimization program which includes automated
exposure control, adjustment of the mA and/or kV according to
patient size and/or use of iterative reconstruction technique.

CONTRAST:  100mL OMNIPAQUE IOHEXOL 300 MG/ML  SOLN
FINDINGS: Lower chest: No acute abnormality.

Hepatobiliary: Hypodense liver. No focal liver abnormality is seen.
No gallstones, gallbladder wall thickening, or biliary dilatation.

Pancreas: No pancreatic ductal dilatation or surrounding
inflammatory changes.

Spleen: Normal in size without focal abnormality.

Adrenals/Urinary Tract: Adrenal glands are unremarkable. Punctate
(sub-2 mm) LEFT inferior renal collecting system nephrolith. Kidneys
are otherwise normal, without focal lesion, or hydronephrosis.
Bladder is unremarkable.

Stomach/Bowel: Stomach is within normal limits. Appendix appears
normal. Nonobstructed small bowel. Nondilated colon.

A mild burden of sigmoid diverticulosis is present.

Mid sigmoid mucosal wall thickening pericolonic stranding and small
foci of pericolonic gas. See key image.

No focal drainable collection or abscess.

Vascular/Lymphatic: No significant vascular findings are present. No
enlarged abdominal or pelvic lymph nodes.

Reproductive: Prostate is unremarkable. Mild engorgement of the
seminal vesicles.

Other: Small fat-containing RIGHT inguinal hernia versus cord
lipoma. No abdominopelvic ascites.

Musculoskeletal: No acute osseous findings.
IMPRESSION: 1. Mid sigmoid thickening, stranding and trace pericolonic gas,
consistent with micro-perforation.
Findings likely to represent acute, uncomplicated sigmoid
diverticulitis.
2. No focal drainable collection or abscess.

Attention: A mild burden of diverticulosis is present and somewhat
incongruent. Recommend repeat imaging and referral for direct
visualization after treatment and resolution to rule out underlying
mass.

## 2022-05-23 ENCOUNTER — Encounter: Payer: Self-pay | Admitting: Emergency Medicine

## 2022-05-23 ENCOUNTER — Ambulatory Visit
Admission: EM | Admit: 2022-05-23 | Discharge: 2022-05-23 | Disposition: A | Discharge status: Home / Self Care | Payer: 59 | Attending: Family Medicine | Admitting: Family Medicine

## 2022-05-23 DIAGNOSIS — J111 Influenza due to unidentified influenza virus with other respiratory manifestations: Secondary | ICD-10-CM

## 2022-05-23 LAB — POCT INFLUENZA A/B
Influenza A, POC: NEGATIVE
Influenza B, POC: NEGATIVE

## 2022-05-23 LAB — POC SARS CORONAVIRUS 2 AG -  ED: SARS Coronavirus 2 Ag: NEGATIVE

## 2022-05-23 NOTE — Discharge Instructions (Signed)
Drink lots of fluids Take ibuprofen or Tylenol for pain and fever May take over-the-counter cough and cold medicine if needed Rest at home until your fever has been gone for 24 hours

## 2022-05-23 NOTE — ED Provider Notes (Signed)
[name]    CSN: [id number] Arrival date & time: 05/23/22  1551      History   Chief Complaint Chief Complaint  Patient presents with   Fever    HPI Johnathan White is a 56 y.o. male.   HPI\  Patient has headache, fatigue, body aches, fever to 102.6.  He has some coughing and congestion, dull headache.  No real sore throat.  No known exposure to illness.  Kids at home are well  Past Medical History:  Diagnosis Date   Back pain, chronic    Chronic heartburn    Hypertension    Spontaneous pneumothorax    as a child    Patient Active Problem List   Diagnosis Date Noted   Diverticulitis 09/08/2021   Non-alcoholic fatty liver disease 09/08/2021   Low back pain 04/20/2011   HYPERTENSION 06/11/2007   EXTERNAL HEMORRHOIDS 06/11/2007   GASTROESOPHAGEAL REFLUX DISEASE, CHRONIC 06/11/2007    Past Surgical History:  Procedure Laterality Date   WISDOM TOOTH EXTRACTION         Home Medications    Prior to Admission medications   Medication Sig Start Date End Date Taking? Authorizing Provider  GAVISCON EXTRA STRENGTH 160-105 MG CHEW Chew 2 tablets by mouth daily as needed (for indigestion).   Yes [provider]  Homeopathic Products [facility] COLD REMEDY) TBDP Take 1-2 tablets by mouth daily as needed (for cold-like symptoms).   Yes [provider]  Multiple Vitamins-Minerals (EMERGEN-C VITAMIN C) PACK Take 1 packet by mouth daily as needed (to boost the immune system- mix with water before drinking).   Yes [provider]  PRESCRIPTION MEDICATION CPAP- At bedtime    [provider]    Family History Family History  Problem Relation Age of Onset   Hypertension Father    Diabetes Maternal Aunt    Diabetes Maternal Grandmother    Sudden death Paternal Grandfather    Hyperlipidemia Neg Hx    Heart attack Neg Hx    Cancer Mother        breast   Mitral valve prolapse Mother    Cancer Sister        breast    Social  History Social History   Tobacco Use   Smoking status: Never   Smokeless tobacco: Never  Vaping Use   Vaping Use: Never used  Substance Use Topics   Alcohol use: No   Drug use: No     Allergies   Bee venom   Review of Systems Review of Systems See HPI  Physical Exam Triage Vital Signs ED Triage Vitals  Enc Vitals Group     BP 05/23/22 1604 (!) 146/82     Pulse Rate 05/23/22 1604 100     Resp 05/23/22 1604 18     Temp 05/23/22 1604 (!) 101 F (38.3 C)     Temp Source 05/23/22 1604 Oral     SpO2 05/23/22 1604 96 %     Weight 05/23/22 1606 220 lb (99.8 kg)     Height 05/23/22 1606 5\' 9"  (1.753 m)     Head Circumference --      Peak Flow --      Pain Score 05/23/22 1606 4     Pain Loc --      Pain Edu? --      Excl. in GC? --    No data found.  Updated Vital Signs BP (!) 146/82 (BP Location: Right Arm)   Pulse  100   Temp (!) 101 F (38.3 C) (Oral)   Resp 18   Ht 5\' 9"  (1.753 m)   Wt 99.8 kg   SpO2 96%   BMI 32.49 kg/m      Physical Exam Constitutional:      General: He is not in acute distress.    Appearance: He is well-developed. He is ill-appearing.  HENT:     Head: Normocephalic and atraumatic.  Eyes:     Conjunctiva/sclera: Conjunctivae normal.     Pupils: Pupils are equal, round, and reactive to light.  Cardiovascular:     Rate and Rhythm: Normal rate.     Heart sounds: Normal heart sounds.  Pulmonary:     Effort: Pulmonary effort is normal. No respiratory distress.     Breath sounds: Normal breath sounds.  Abdominal:     General: There is no distension.     Palpations: Abdomen is soft.  Musculoskeletal:        General: Normal range of motion.     Cervical back: Normal range of motion.  Skin:    General: Skin is warm and dry.  Neurological:     Mental Status: He is alert.      UC Treatments / Results  Labs (all labs ordered are listed, but only abnormal results are displayed) Labs Reviewed  POC SARS CORONAVIRUS 2 AG -  ED   POCT INFLUENZA A/B    EKG   Radiology No results found.  Procedures Procedures (including critical care time)  Medications Ordered in UC Medications - No data to display  Initial Impression / Assessment and Plan / UC Course  I have reviewed the triage vital signs and the nursing notes.  Pertinent labs & imaging results that were available during my care of the patient were reviewed by me and considered in my medical decision making (see chart for details).     COVID test negative, flu test negative.  Discussed viral illness and symptomatic care Final Clinical Impressions(s) / UC Diagnoses   Final diagnoses:  Influenza-like illness     Discharge Instructions      Drink lots of fluids Take ibuprofen or Tylenol for pain and fever May take over-the-counter cough and cold medicine if needed Rest at home until your fever has been gone for 24 hours   ED Prescriptions   None    PDMP not reviewed this encounter.   , MD 05/23/22 [phone]

## 2022-05-23 NOTE — ED Triage Notes (Signed)
Patient c/o fever x 1 day, highest 102.6, body aches.  Slight cough, dull headache.  Patient has taken Advil @ 3pm.

## 2023-02-24 ENCOUNTER — Encounter: Payer: Self-pay | Admitting: Emergency Medicine

## 2023-02-24 ENCOUNTER — Ambulatory Visit
Admission: EM | Admit: 2023-02-24 | Discharge: 2023-02-24 | Disposition: A | Discharge status: Home / Self Care | Payer: 59 | Attending: Family Medicine | Admitting: Family Medicine

## 2023-02-24 DIAGNOSIS — U071 COVID-19: Secondary | ICD-10-CM | POA: Diagnosis not present

## 2023-02-24 MED ORDER — PAXLOVID (300/100) 20 X 150 MG & 10 X 100MG PO TBPK: 3.0000 | ORAL_TABLET | Freq: Two times a day (BID) | ORAL | 0 refills | Status: AC

## 2023-02-24 NOTE — ED Triage Notes (Signed)
Patient c/o fever, chest congestion, body aches headaches, dizziness since yesterday.  Patient took a home COVID test yesterday and it was positive.  Patient hoping to get started on medication.

## 2023-02-24 NOTE — Discharge Instructions (Signed)
Take Paxlovid 2 times a day Drink plenty of fluids Make over-the-counter cough and cold medicines Call for problems

## 2023-02-24 NOTE — ED Provider Notes (Signed)
[name]    CSN: [id number] Arrival date & time: 02/24/23  1132      History   Chief Complaint Chief Complaint  Patient presents with   Fever    HPI Johnathan White is a 57 y.o. male.   Patient states that he started feeling bad Friday evening.  Woke up Saturday feeling worse.  Today is Sunday and he did a COVID test.  It is positive.  He has bodyaches, headache, fatigue, cough and mild sore throat.  He is otherwise healthy.  On no prescription medication.  Uses CPAP for OSA    Past Medical History:  Diagnosis Date   Back pain, chronic    Chronic heartburn    Hypertension    Spontaneous pneumothorax    as a child    Patient Active Problem List   Diagnosis Date Noted   Diverticulitis 09/08/2021   Non-alcoholic fatty liver disease 09/08/2021   Low back pain 04/20/2011   HYPERTENSION 06/11/2007   EXTERNAL HEMORRHOIDS 06/11/2007   GASTROESOPHAGEAL REFLUX DISEASE, CHRONIC 06/11/2007    Past Surgical History:  Procedure Laterality Date   WISDOM TOOTH EXTRACTION         Home Medications    Prior to Admission medications   Medication Sig Start Date End Date Taking? Authorizing Provider  nirmatrelvir & ritonavir (PAXLOVID, 300/100,) 20 x 150 MG & 10 x 100MG  TBPK Take 3 tablets by mouth 2 (two) times daily for 5 days. 02/24/23 03/01/23 Yes Eustace Moore, MD  GAVISCON EXTRA STRENGTH 160-105 MG CHEW Chew 2 tablets by mouth daily as needed (for indigestion).    [provider]  Homeopathic Products [facility] COLD REMEDY) TBDP Take 1-2 tablets by mouth daily as needed (for cold-like symptoms).    [provider]  Multiple Vitamins-Minerals (EMERGEN-C VITAMIN C) PACK Take 1 packet by mouth daily as needed (to boost the immune system- mix with water before drinking).    [provider]  PRESCRIPTION MEDICATION CPAP- At bedtime    [provider]    Family History Family History  Problem Relation Age of Onset    Hypertension Father    Diabetes Maternal Aunt    Diabetes Maternal Grandmother    Sudden death Paternal Grandfather    Hyperlipidemia Neg Hx    Heart attack Neg Hx    Cancer Mother        breast   Mitral valve prolapse Mother    Cancer Sister        breast    Social History Social History   Tobacco Use   Smoking status: Never   Smokeless tobacco: Never  Vaping Use   Vaping status: Never Used  Substance Use Topics   Alcohol use: No   Drug use: No     Allergies   Bee venom   Review of Systems Review of Systems See HPI  Physical Exam Triage Vital Signs ED Triage Vitals  Encounter Vitals Group     BP 02/24/23 1141 (!) 144/90     Systolic BP Percentile --      Diastolic BP Percentile --      Pulse Rate 02/24/23 1141 100     Resp 02/24/23 1141 18     Temp 02/24/23 1141 99.3 F (37.4 C)     Temp Source 02/24/23 1141 Oral     SpO2 02/24/23 1141 95 %     Weight 02/24/23 1143 220 lb (99.8 kg)     Height 02/24/23 1143 5\' 9"  (  1.753 m)     Head Circumference --      Peak Flow --      Pain Score 02/24/23 1142 3     Pain Loc --      Pain Education --      Exclude from Growth Chart --    No data found.  Updated Vital Signs BP (!) 144/90 (BP Location: Right Arm)   Pulse 100   Temp 99.3 F (37.4 C) (Oral)   Resp 18   Ht 5\' 9"  (1.753 m)   Wt 99.8 kg   SpO2 95%   BMI 32.49 kg/m      Physical Exam Constitutional:      General: He is not in acute distress.    Appearance: He is well-developed. He is ill-appearing.     Comments: Patient is overweight.  Appears tired  HENT:     Head: Normocephalic and atraumatic.  Eyes:     Conjunctiva/sclera: Conjunctivae normal.     Pupils: Pupils are equal, round, and reactive to light.  Cardiovascular:     Rate and Rhythm: Normal rate.  Pulmonary:     Effort: Pulmonary effort is normal. No respiratory distress.  Musculoskeletal:        General: Normal range of motion.     Cervical back: Normal range of motion.   Skin:    General: Skin is warm and dry.  Neurological:     Mental Status: He is alert.     Gait: Gait normal.      UC Treatments / Results  Labs (all labs ordered are listed, but only abnormal results are displayed) Labs Reviewed - No data to display  EKG   Radiology No results found.  Procedures Procedures (including critical care time)  Medications Ordered in UC Medications - No data to display  Initial Impression / Assessment and Plan / UC Course  I have reviewed the triage vital signs and the nursing notes.  Pertinent labs & imaging results that were available during my care of the patient were reviewed by me and considered in my medical decision making (see chart for details).     Discussed treatment of COVID.  Quarantine.  Paxlovid risks and benefits Final Clinical Impressions(s) / UC Diagnoses   Final diagnoses:  COVID-19     Discharge Instructions      Take Paxlovid 2 times a day Drink plenty of fluids Make over-the-counter cough and cold medicines Call for problems   ED Prescriptions     Medication Sig Dispense Auth. Provider   nirmatrelvir & ritonavir (PAXLOVID, 300/100,) 20 x 150 MG & 10 x 100MG  TBPK Take 3 tablets by mouth 2 (two) times daily for 5 days. 30 tablet Eustace Moore, MD      PDMP not reviewed this encounter.   Eustace Moore, MD 02/24/23 [phone]

## 2023-04-19 ENCOUNTER — Emergency Department (HOSPITAL_BASED_OUTPATIENT_CLINIC_OR_DEPARTMENT_OTHER): Payer: 59

## 2023-04-19 ENCOUNTER — Emergency Department (HOSPITAL_BASED_OUTPATIENT_CLINIC_OR_DEPARTMENT_OTHER)
Admission: EM | Admit: 2023-04-19 | Discharge: 2023-04-19 | Disposition: A | Discharge status: Home / Self Care | Payer: 59 | Attending: Emergency Medicine | Admitting: Emergency Medicine

## 2023-04-19 ENCOUNTER — Encounter (HOSPITAL_BASED_OUTPATIENT_CLINIC_OR_DEPARTMENT_OTHER): Payer: Self-pay

## 2023-04-19 ENCOUNTER — Other Ambulatory Visit: Payer: Self-pay

## 2023-04-19 DIAGNOSIS — R519 Headache, unspecified: Secondary | ICD-10-CM | POA: Insufficient documentation

## 2023-04-19 DIAGNOSIS — R791 Abnormal coagulation profile: Secondary | ICD-10-CM | POA: Insufficient documentation

## 2023-04-19 DIAGNOSIS — R202 Paresthesia of skin: Secondary | ICD-10-CM | POA: Insufficient documentation

## 2023-04-19 DIAGNOSIS — I10 Essential (primary) hypertension: Secondary | ICD-10-CM | POA: Diagnosis not present

## 2023-04-19 LAB — DIFFERENTIAL
Abs Immature Granulocytes: 0.03 10*3/uL (ref 0.00–0.07)
Basophils Absolute: 0.1 10*3/uL (ref 0.0–0.1)
Basophils Relative: 1 %
Eosinophils Absolute: 0.3 10*3/uL (ref 0.0–0.5)
Eosinophils Relative: 4 %
Immature Granulocytes: 0 %
Lymphocytes Relative: 37 %
Lymphs Abs: 2.6 10*3/uL (ref 0.7–4.0)
Monocytes Absolute: 0.7 10*3/uL (ref 0.1–1.0)
Monocytes Relative: 10 %
Neutro Abs: 3.4 10*3/uL (ref 1.7–7.7)
Neutrophils Relative %: 48 %

## 2023-04-19 LAB — RAPID URINE DRUG SCREEN, HOSP PERFORMED
Amphetamines: NOT DETECTED
Barbiturates: NOT DETECTED
Benzodiazepines: NOT DETECTED
Cocaine: NOT DETECTED
Opiates: NOT DETECTED
Tetrahydrocannabinol: NOT DETECTED

## 2023-04-19 LAB — CBC
HCT: 45.2 % (ref 39.0–52.0)
Hemoglobin: 15.8 g/dL (ref 13.0–17.0)
MCH: 32.2 pg (ref 26.0–34.0)
MCHC: 35 g/dL (ref 30.0–36.0)
MCV: 92.2 fL (ref 80.0–100.0)
Platelets: 259 10*3/uL (ref 150–400)
RBC: 4.9 MIL/uL (ref 4.22–5.81)
RDW: 12.3 % (ref 11.5–15.5)
WBC: 7.1 10*3/uL (ref 4.0–10.5)
nRBC: 0 % (ref 0.0–0.2)

## 2023-04-19 LAB — URINALYSIS, ROUTINE W REFLEX MICROSCOPIC
Bilirubin Urine: NEGATIVE
Glucose, UA: NEGATIVE mg/dL
Hgb urine dipstick: NEGATIVE
Ketones, ur: NEGATIVE mg/dL
Leukocytes,Ua: NEGATIVE
Nitrite: NEGATIVE
Protein, ur: NEGATIVE mg/dL
Specific Gravity, Urine: 1.02 (ref 1.005–1.030)
pH: 6 (ref 5.0–8.0)

## 2023-04-19 LAB — COMPREHENSIVE METABOLIC PANEL
ALT: 60 U/L — ABNORMAL HIGH (ref 0–44)
AST: 37 U/L (ref 15–41)
Albumin: 4.4 g/dL (ref 3.5–5.0)
Alkaline Phosphatase: 56 U/L (ref 38–126)
Anion gap: 11 (ref 5–15)
BUN: 9 mg/dL (ref 6–20)
CO2: 24 mmol/L (ref 22–32)
Calcium: 9.2 mg/dL (ref 8.9–10.3)
Chloride: 102 mmol/L (ref 98–111)
Creatinine, Ser: 0.88 mg/dL (ref 0.61–1.24)
GFR, Estimated: >60 mL/min (ref >60)
Glucose, Bld: 124 mg/dL — ABNORMAL HIGH (ref 70–99)
Potassium: 3.3 mmol/L — ABNORMAL LOW (ref 3.5–5.1)
Sodium: 137 mmol/L (ref 135–145)
Total Bilirubin: 1.2 mg/dL — ABNORMAL HIGH (ref <1.2)
Total Protein: 7.1 g/dL (ref 6.5–8.1)

## 2023-04-19 LAB — CBG MONITORING, ED: Glucose-Capillary: 136 mg/dL — ABNORMAL HIGH (ref 70–99)

## 2023-04-19 LAB — ETHANOL: Alcohol, Ethyl (B): <10 mg/dL (ref <10)

## 2023-04-19 LAB — PROTIME-INR
INR: 1 (ref 0.8–1.2)
Prothrombin Time: 13.2 s (ref 11.4–15.2)

## 2023-04-19 MED ORDER — PROCHLORPERAZINE EDISYLATE 10 MG/2ML IJ SOLN
10.0000 mg | Freq: Once | INTRAMUSCULAR | Status: AC
  Administered 2023-04-19: 10 mg via INTRAVENOUS
  Filled 2023-04-19: qty 2

## 2023-04-19 MED ORDER — ACETAMINOPHEN 325 MG PO TABS
650.0000 mg | ORAL_TABLET | Freq: Once | ORAL | Status: AC
  Administered 2023-04-19: 650 mg via ORAL
  Filled 2023-04-19: qty 2

## 2023-04-19 MED ORDER — POTASSIUM CHLORIDE CRYS ER 20 MEQ PO TBCR
40.0000 meq | EXTENDED_RELEASE_TABLET | Freq: Once | ORAL | Status: AC
  Administered 2023-04-19: 40 meq via ORAL
  Filled 2023-04-19: qty 2

## 2023-04-19 MED ORDER — DIPHENHYDRAMINE HCL 50 MG/ML IJ SOLN
25.0000 mg | Freq: Once | INTRAMUSCULAR | Status: AC
  Administered 2023-04-19: 25 mg via INTRAVENOUS
  Filled 2023-04-19: qty 1

## 2023-04-19 NOTE — ED Provider Notes (Signed)
[place] [facility] Provider Note   CSN: [id number] Arrival date & time: 04/19/23  1629     History  Chief Complaint  Patient presents with   Tingling   Headache    Johnathan White is a 57 y.o. male history of hypertension, nonalcoholic fatty liver disease, GERD presented with a headache that began at 3:50 PM today.  Patient states over the past 2 days he has been having vision changes that come and go but is unable to say which I reviewed both of his eyes.  Patient states that Tuesday night he woke up with headache that almost immediately resolved.  Patient has not taken any medications for this headache.  Patient states that he is unsure what makes it worse but denies any jaw claudication, new onset weakness.  Patient notes that today when he had the headache he began having numbness in his lips and that prompted him to go to the ER to be evaluated.  Patient does note that he did have COVID last month as well.  Patient denies chest pain, shortness of breath, abdominal pain, nauseous vomiting, neck pain, gait abnormalities, new onset weakness, previous history of strokes or blood clots  Home Medications Prior to Admission medications   Medication Sig Start Date End Date Taking? Authorizing Provider  GAVISCON EXTRA STRENGTH 160-105 MG CHEW Chew 2 tablets by mouth daily as needed (for indigestion).    [provider]  Homeopathic Products [facility] COLD REMEDY) TBDP Take 1-2 tablets by mouth daily as needed (for cold-like symptoms).    [provider]  Multiple Vitamins-Minerals (EMERGEN-C VITAMIN C) PACK Take 1 packet by mouth daily as needed (to boost the immune system- mix with water before drinking).    [provider]  PRESCRIPTION MEDICATION CPAP- At bedtime    [provider]      Allergies    Bee venom    Review of Systems   Review of Systems  Neurological:  Positive for headaches.    Physical  Exam Updated Vital Signs BP 122/84   Pulse 76   Temp 97.8 F (36.6 C) (Oral)   Resp 18   SpO2 96%  Physical Exam Constitutional:      Appearance: He is normal weight.  HENT:     Head: Normocephalic.     Comments: No temporal artery tenderness No jaw claudication Tender palpation right frontal sinus Eyes:     Extraocular Movements: Extraocular movements intact.     Conjunctiva/sclera: Conjunctivae normal.     Pupils: Pupils are equal, round, and reactive to light.  Cardiovascular:     Rate and Rhythm: Normal rate and regular rhythm.     Pulses: Normal pulses.     Heart sounds: Normal heart sounds.  Pulmonary:     Effort: Pulmonary effort is normal.     Breath sounds: Normal breath sounds.  Abdominal:     Palpations: Abdomen is soft.     Tenderness: There is no abdominal tenderness. There is no guarding or rebound.  Musculoskeletal:        General: Normal range of motion.     Cervical back: Normal range of motion. No rigidity or tenderness.  Skin:    General: Skin is warm and dry.     Capillary Refill: Capillary refill takes less than 2 seconds.  Neurological:     General: No focal deficit present.     Mental Status: He is alert.     Sensory: Sensation  is intact.     Motor: Motor function is intact.     Coordination: Coordination is intact.     Gait: Gait is intact.     Comments: Cranial nerves III through XII intact Vision grossly intact Sensation intact in all 4 extremities  Psychiatric:        Mood and Affect: Mood normal.     ED Results / Procedures / Treatments   Labs (all labs ordered are listed, but only abnormal results are displayed) Labs Reviewed  COMPREHENSIVE METABOLIC PANEL - Abnormal; Notable for the following components:      Result Value   Potassium 3.3 (*)    Glucose, Bld 124 (*)    ALT 60 (*)    Total Bilirubin 1.2 (*)    All other components within normal limits  CBG MONITORING, ED - Abnormal; Notable for the following components:    Glucose-Capillary 136 (*)    All other components within normal limits  ETHANOL  PROTIME-INR  CBC  DIFFERENTIAL  RAPID URINE DRUG SCREEN, HOSP PERFORMED  URINALYSIS, ROUTINE W REFLEX MICROSCOPIC    EKG EKG Interpretation Date/Time:  Friday April 19 2023 18:36:29 EST Ventricular Rate:  56 PR Interval:  180 QRS Duration:  114 QT Interval:  413 QTC Calculation: 399 R Axis:   -7  Text Interpretation: Sinus rhythm Incomplete right bundle branch block Low voltage, precordial leads Minimal ST elevation, inferior leads No significant change since last tracing Confirmed by Jacalyn Lefevre [phone]) on 04/19/2023 6:38:37 PM  Radiology CT HEAD WO CONTRAST  Result Date: 04/19/2023 CLINICAL DATA:  Headache and tingling to his lips. EXAM: CT HEAD WITHOUT CONTRAST TECHNIQUE: Contiguous axial images were obtained from the base of the skull through the vertex without intravenous contrast. RADIATION DOSE REDUCTION: This exam was performed according to the departmental dose-optimization program which includes automated exposure control, adjustment of the mA and/or kV according to patient size and/or use of iterative reconstruction technique. COMPARISON:  None Available. FINDINGS: Brain: No evidence of acute infarction, hemorrhage, hydrocephalus, extra-axial collection or mass lesion/mass effect. Vascular: No hyperdense vessel or unexpected calcification. Skull: Normal. Negative for fracture or focal lesion. Sinuses/Orbits: No acute finding. Other: None. IMPRESSION: Normal head CT. Electronically Signed   By: Aram Candela M.D.   On: 04/19/2023 21:17    Procedures Procedures    Medications Ordered in ED Medications  acetaminophen (TYLENOL) tablet 650 mg (650 mg Oral Given 04/19/23 1735)  potassium chloride SA (KLOR-CON M) CR tablet 40 mEq (40 mEq Oral Given 04/19/23 1735)  prochlorperazine (COMPAZINE) injection 10 mg (10 mg Intravenous Given 04/19/23 1903)  diphenhydrAMINE (BENADRYL) injection 25 mg  (25 mg Intravenous Given 04/19/23 1903)    ED Course/ Medical Decision Making/ A&P                                 Medical Decision Making Amount and/or Complexity of Data Reviewed Labs: ordered. Radiology: ordered.  Risk OTC drugs. Prescription drug management.   Alberteen Spindle 57 y.o. presented today for HA. Working DDx that I considered at this time includes, but not limited to, tension headache, migraine, intracranial mass, intracranial hemorrhage, intracranial infection including meningitis vs encephalitis, GCA, trigeminal neuralgia, AVM, sinusitis, cerebral aneurysm, muscular headache, cavernous sinus thrombosis, carotid artery dissection.  R/o DDx: intracranial mass, intracranial hemorrhage, intracranial infection including meningitis vs encephalitis, GCA, trigeminal neuralgia, AVM, sinusitis, cerebral aneurysm, muscular headache, cavernous sinus thrombosis, carotid artery dissection:  less likely due to history of present illness, physical exam, labs/imaging findings  Review of prior external notes: 02/24/2023 ED  Unique Tests and My Interpretation:  CBC: Unremarkable CMP: Mild hypokalemia 3.3 UA: Unremarkable UDS: Unremarkable Ethanol: Negative PT/INR: Unremarkable CT Head w/o Contrast: No acute findings EKG: Sinus 56 bpm, incomplete right bundle branch block, no ST elevations or depressions noted, similar to previous reading  Social Determinants of Health: none  Discussion with Independent Historian:  Wife  Discussion of Management of Tests: None  Risk: Medium: prescription drug management  Risk Stratification Score: none  Staffed with [place], MD  Plan: On exam patient was in no acute distress with stable vitals.  Patient's physical exam does show tenderness to the right frontal sinus but otherwise patient had reassuring physical exam including neurologic exam.  Labs and CT scan ordered out of triage and will give patient Tylenol here as his labs are  unremarkable.  Given patient's tenderness to his frontal sinus do suspect this is sinus related however will rule out intracranial pathologies.  On recheck patient stated he is still having headache after the Tylenol and so we will give Compazine and Benadryl.  Patient's labs are reassuring and the CT scan grossly does not appear abnormal however still awaiting the read.  The plan moving forward is to recheck on patient of his headache is better than he can be discharged with neurology follow-up however if he is not feeling better then patient will need to be transferred to [facility] or [facility] for an MRI as this patient is endorsing paresthesias on the right side of his face and that this headache is different than previous headaches and we do not have MRI at this facility today.  On recheck patient today felt much better with the Compazine and Benadryl.  Still waiting on the head CT scan however patient I had shared decision making and patient states he does not feel he needs an MRI at this time as he does feel better.  Will encourage patient to follow-up with primary care provider and give neurology follow-up pending CT scan.  CT scan was negative.  Patient states he feels much better after the pain meds and can follow-up outpatient and does not want to stay or be transferred for an MRI.  Encourage patient use Tylenol every 6 hours needed for pain and to follow-up with his primary care provider and will provide neurology referral as well.  Patient was given return precautions. Patient stable for discharge at this time.  Patient verbalized understanding of plan.  This chart was dictated using voice recognition software.  Despite best efforts to proofread,  errors can occur which can change the documentation meaning.         Final Clinical Impression(s) / ED Diagnoses Final diagnoses:  Bad headache    Rx / DC Orders ED Discharge Orders          Ordered    Ambulatory referral to Neurology        Comments: An appointment is requested in approximately: 1 week   04/19/23 2122              Netta Corrigan, PA-C 04/19/23 2224    Jacalyn Lefevre, MD 04/19/23 2257

## 2023-04-19 NOTE — Discharge Instructions (Signed)
Please follow-up with your primary care provider regards to recent symptoms and ER visit.  Today your labs and imaging were all reassuring as we agreed we agreed to forego an MRI at this time would include transfer.  Please follow-up with your primary care provider and a neurologist I have attached you for you to further look into your headache.  You may take Tylenol every 6 hours as needed for pain.  Symptoms change or worsen please return to ER.

## 2023-04-19 NOTE — ED Triage Notes (Signed)
The patient has a headache and then at 1600 today noticed tingling to his lips. MD at bedside. Wednesday he had an episode of his vision changes.

## 2023-04-22 ENCOUNTER — Encounter: Payer: Self-pay | Admitting: Neurology

## 2023-05-14 NOTE — Progress Notes (Unsigned)
Initial neurology clinic note  Johnathan White MRN: [id number] DOB: [birth year]  Referring provider: Remi Deter  Primary care provider: Sigmund Hazel, MD  Reason for consult:  headache  Subjective:  This is [name], a 57 y.o. right-handed male with a medical history of HTN, non-alcoholic fatty liver disease, OSA (not on CPAP since 11/2022), and GERD who presents to neurology clinic with headaches. The patient is alone today.  Patient has had headaches for years, since a child. These minor and irregular. It can hurt in the neck and top/front of head. He had no photophobia, phonophobia, nausea, or vomiting. He never noticed autonomic symptoms either. He would rate the pain 2/10. The headache would resolve with advil. It may last 30 minutes. He would have 2-3 headaches per month.  He is now having headaches in different parts of his head. The pain was in the neck initially. He is noticing more pain along the right eyebrow lately. He rates the pain worse, but difficult to put a number to it. It is a much stabbing pain. The headaches are becoming more frequent. He has about 2 headaches per week now. He also has flashing, geometric flashing lights in his eyes prior to headaches starting (about once per week over the last month). He endorses photophobia and thinks this may trigger the flashing lights. He endorses phonophobia. He denies nausea or vomiting. He had some vision changes and lip tingling as well. He does not recall if he had a headache with those. They only occurred once. When he gets this headache he goes to a dark quiet room and lays down. He will take advil which helps. The headache can last hours if he takes nothing. He will take 800 mg of advil and the headache will resolve within 30 minutes.   He went to the ED on 04/19/23 due to lip numbness and vision changes. He had a CT head that was normal. Patient had tenderness in frontal sinus area - felt to be cause per ED. His  HA improved with a couple of headache cocktails (but patient mentions his head did not hurt too much).  He has noticed an increase in BP recently and chronic tinnitus as well.  He also mentions an intermittent spasm in the left index finger. It is an involuntary twitch of the index finger. He does not think he can stop it. It lasts a few seconds and occurs a few times per week. He has some joint pain, but denies numbness or tingling. He feels reduced strength in the left hand.  EtOH use: No Caffeine use: 2L of soda per day, maybe more Smoker: No Family history of neurologic disease, including headaches: No  MEDICATIONS:  Outpatient Encounter Medications as of 05/15/2023  Medication Sig   GAVISCON EXTRA STRENGTH 160-105 MG CHEW Chew 2 tablets by mouth daily as needed (for indigestion).   Homeopathic Products (ZICAM COLD REMEDY) TBDP Take 1-2 tablets by mouth daily as needed (for cold-like symptoms).   PRESCRIPTION MEDICATION CPAP- At bedtime   Multiple Vitamins-Minerals (EMERGEN-C VITAMIN C) PACK Take 1 packet by mouth daily as needed (to boost the immune system- mix with water before drinking). (Patient not taking: Reported on 05/15/2023)   No facility-administered encounter medications on file as of 05/15/2023.    PAST MEDICAL HISTORY: Past Medical History:  Diagnosis Date   Back pain, chronic    Chronic heartburn    Hypertension    Spontaneous pneumothorax    as  a child    PAST SURGICAL HISTORY: Past Surgical History:  Procedure Laterality Date   WISDOM TOOTH EXTRACTION      ALLERGIES: Allergies  Allergen Reactions   Bee Venom Hives, Swelling and Other (See Comments)    Site stung swells and develops large welts. Patient also felt "tension" in his throat.    FAMILY HISTORY: Family History  Problem Relation Age of Onset   Hypertension Father    Diabetes Maternal Aunt    Diabetes Maternal Grandmother    Sudden death Paternal Grandfather    Hyperlipidemia Neg Hx     Heart attack Neg Hx    Cancer Mother        breast   Mitral valve prolapse Mother    Cancer Sister        breast    SOCIAL HISTORY: Social History   Tobacco Use   Smoking status: Never   Smokeless tobacco: Never  Vaping Use   Vaping status: Never Used  Substance Use Topics   Alcohol use: No   Drug use: No   Social History   Social History Narrative   Are you right handed or left handed? Right   Are you currently employed ?    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 Wife and 4 children   What type of home do you live in: 1 story or 2 story? two    Caffiene a lot of soda , tea, coffee    Objective:  Vital Signs:  BP 120/86   Pulse 94   Ht 5\' 7"  (1.702 m)   Wt 230 lb (104.3 kg)   SpO2 98%   BMI 36.02 kg/m   General: No acute distress.  Patient appears well-groomed.   Head:  Normocephalic/atraumatic Eyes:  fundi examined, disc margins clear, no obvious papilledema. Neck: supple, positive for paraspinal tenderness and reduced range of motion Lungs: Non-labored breathing on room air   Neurological Exam: Mental status: alert and oriented, speech fluent and not dysarthric, language intact.  Cranial nerves: CN I: not tested CN II: pupils equal, round and reactive to light, visual fields intact CN III, IV, VI:  full range of motion, no nystagmus, no ptosis CN V: facial sensation intact. CN VII: upper and lower face symmetric CN VIII: hearing intact CN IX, X: uvula midline CN XI: sternocleidomastoid and trapezius muscles intact CN XII: tongue midline  Bulk & Tone: normal, no fasciculations. Motor:  muscle strength 5/5 throughout Deep Tendon Reflexes:  2+ throughout,  toes downgoing.   Sensation:  Pinprick, temperature and vibratory sensation intact. Finger to nose testing:  Without dysmetria.   Gait:  Normal station and stride.  Romberg negative.   Labs and Imaging review: Internal  labs: 04/19/23: CMP significant for K 3.3, glucose 124, ALT 60 CBC unremarkable  External labs: TSH (12/01/19): wnl  Imaging: CT head wo contrast (04/19/23): FINDINGS: Brain: No evidence of acute infarction, hemorrhage, hydrocephalus, extra-axial collection or mass lesion/mass effect.   Vascular: No hyperdense vessel or unexpected calcification.   Skull: Normal. Negative for fracture or focal lesion.   Sinuses/Orbits: No acute finding.   Other: None.   IMPRESSION: Normal head CT.  EKG EKG Interpretation Date/Time:                  Friday April 19 2023 18:36:29 EST Ventricular Rate:         56 PR Interval:  180 QRS Duration:             114 QT Interval:                 413 QTC Calculation:399 R Axis:                         -7   Text Interpretation:Sinus rhythm Incomplete right bundle branch block Low voltage, precordial leads Minimal ST elevation, inferior leads No significant change since last tracing Confirmed by Jacalyn Lefevre [phone]) on 04/19/2023 6:38:37 PM  MRI lumbar spine wo contrast (06/09/2011): Findings: Normal signal is present in the conus medullaris which  terminates at L1, within normal limits.  Marrow signal, vertebral  body heights, alignment are normal.  Limited imaging of the abdomen  is unremarkable.   The disc levels at L3-4 and above are normal.   L4-5: A left paracentral disc protrusion is present at L4-5 with  severe impact on the left lateral recess.  There is mild left  foraminal narrowing.   L5-S1:  Mild disc bulging is present without significant stenosis.   IMPRESSION:   1. A left paracentral disc protrusion at L4-5 results in moderate  to severe left lateral recess narrowing, potentially impacting the  left S1 nerve root.  2.  Mild left foraminal narrowing at L4-5.  3.  No other significant disc disease.    Assessment/Plan:  Johnathan White is a 58 y.o. male who presents for evaluation of headaches. He has a  relevant medical history of HTN, non-alcoholic fatty liver disease, OSA (not on CPAP since 11/2022), and GERD. His neurological examination is essentially normal today. Available diagnostic data is significant for normal CT head. Patient's headaches sound most consistent with migraine with aura. He is currently having about 1 headache per week that responds well to ibuprofen 800 mg. Given his age and the change in headaches, MRI brain is reasonable to ensure no intracranial pathology that may have been missed on CT head. We discussed contributing factors such as soda intake, untreated OSA, neck pain, and high blood pressure. We also discussed medication, but patient would like to try non-pharmacologic options first.  PLAN: -Blood work: B12, TSH -MRI brain w/wo contrast For headaches: Migraine prevention: Discussed medications. Patient is not sure he wants. Will give OTC recommendations Migraine rescue: Can continue advil as it seems to work well Limit use of pain relievers to no more than 2 days out of week to prevent risk of rebound or medication-overuse headache. Keep headache diary  -Physical therapy for neck pain  -BP elevated today, but okay on recheck. Patient to discuss with PCP if BP continues to be a concern. -Discussed importance of treating CPAP. Patient to start back and will consider sleep medicine consult or discussing with PCP.  -Return to clinic in 3-4 months  The impression above as well as the plan as outlined below were extensively discussed with the patient who voiced understanding. All questions were answered to their satisfaction.  When available, results of the above investigations and possible further recommendations will be communicated to the patient via telephone/MyChart. Patient to call office if not contacted after expected testing turnaround time.   Total time spent reviewing records, interview, history/exam, documentation, and coordination of care on day of  encounter:  60 min   Thank you for allowing me to participate in patient's care.  If I can answer any additional questions, I would be pleased to do so.  Johnathan Balint, MD   CC: Sigmund Hazel, MD [address] [place] [address]  CC: Referring provider: Netta Corrigan, PA-C [address] [place],  [place] [address]

## 2023-05-15 ENCOUNTER — Other Ambulatory Visit: Payer: 59

## 2023-05-15 ENCOUNTER — Encounter: Payer: Self-pay | Admitting: Neurology

## 2023-05-15 ENCOUNTER — Ambulatory Visit (INDEPENDENT_AMBULATORY_CARE_PROVIDER_SITE_OTHER): Payer: 59 | Admitting: Neurology

## 2023-05-15 VITALS — BP 120/86 | HR 94 | Ht 67.0 in | Wt 230.0 lb

## 2023-05-15 DIAGNOSIS — G43109 Migraine with aura, not intractable, without status migrainosus: Secondary | ICD-10-CM

## 2023-05-15 DIAGNOSIS — H539 Unspecified visual disturbance: Secondary | ICD-10-CM | POA: Diagnosis not present

## 2023-05-15 DIAGNOSIS — I10 Essential (primary) hypertension: Secondary | ICD-10-CM

## 2023-05-15 DIAGNOSIS — R202 Paresthesia of skin: Secondary | ICD-10-CM

## 2023-05-15 DIAGNOSIS — M62838 Other muscle spasm: Secondary | ICD-10-CM

## 2023-05-15 DIAGNOSIS — M542 Cervicalgia: Secondary | ICD-10-CM

## 2023-05-15 DIAGNOSIS — G4733 Obstructive sleep apnea (adult) (pediatric): Secondary | ICD-10-CM

## 2023-05-15 NOTE — Patient Instructions (Addendum)
I saw you today for headaches. Your headaches sound consistent with migraine with aura. I would like to investigate further with the following: -Blood work today -MRI of your brain  I will be in touch when I have those results.  I am referring you to physical therapy for neck pain as this often helps with headaches as well.  I want you to re-establish care with your primary care doctor and make sure you discuss your blood pressure.  You are also going to restart CPAP for your sleep apnea. I can refer you to sleep medicine if you decide you want to see them or you can discuss with your primary care doctor.  Below is more information on migraines. You want to start with the above changes and maybe supplements before trying medications. They are available if you want though.  I will see you back in clinic in 3-4 months. Please let me know if you have any questions or concerns in the meantime.    More migraine information: Be aware of common food triggers:  - Caffeine:  coffee, black tea, cola, Mt. Dew  - Chocolate  - Dairy:  aged cheeses (brie, blue, cheddar, gouda, [place], provolone, [place], Swiss, etc), chocolate milk, buttermilk, sour cream, limit eggs and yogurt  - Nuts, peanut butter  - Alcohol  - Cereals/grains:  FRESH breads (fresh bagels, sourdough, doughnuts), yeast productions  - Processed/canned/aged/cured meats (pre-packaged deli meats, hotdogs)  - MSG/glutamate:  soy sauce, flavor enhancer, pickled/preserved/marinated foods  - Sweeteners:  aspartame (Equal, Nutrasweet).  Sugar and Splenda are okay  - Vegetables:  legumes (lima beans, lentils, snow peas, fava beans, pinto peans, peas, garbanzo beans), sauerkraut, onions, olives, pickles  - Fruit:  avocados, bananas, citrus fruit (orange, lemon, grapefruit), mango  - Other:  Frozen meals, macaroni and cheese Routine exercise Stay adequately hydrated (aim for 64 oz water daily) Keep headache diary Maintain proper stress  management Maintain proper sleep hygiene Do not skip meals  Vitamins and herbs that show potential to help with headaches:  Magnesium: Magnesium (250 mg twice a day or 500 mg at bed) has a relaxant effect on smooth muscles such as blood vessels. Individuals suffering from frequent or daily headache usually have low magnesium levels which can be increase with daily supplementation of 400-750 mg. Three trials found 40-90% average headache reduction when used as a preventative. Magnesium also demonstrated the benefit in menstrually related migraine. Magnesium is part of the messenger system in the serotonin cascade and it is a good muscle relaxant. It is also useful for constipation which can be a side effect of other medications used to treat migraine. Good sources include nuts, whole grains, and tomatoes.  Riboflavin (vitamin B2) 200 mg twice a day. This vitamin assists nerve cells in the production of ATP a principal energy storing molecule. It is necessary for many chemical reactions in the body. There have been at least 3 clinical trials of riboflavin using 400 mg per day all of which suggested that migraine frequency can be decreased. All 3 trials showed significant improvement in over half of migraine sufferers. The supplement is found in bread, cereal, milk, meat, and poultry. Most Americans get more riboflavin than the recommended daily allowance, however riboflavin deficiency is not necessary for the supplements to help prevent headache.  Feverfew: Feverfew is a common garden herb native to [place] and popular in [place] as a treatment for disorders typically controlled by aspirin. The mechanism of action is unknown but is believed  to be related to a chemical called parthenolide which helps the body use serotonin more effectively. Serotonin helps prevent migraine and assists with resolution when it occurs. Parthenolide also inhibits the release of histamine which is linked to pain and  inflammation.  Consistency of active ingredients in different products can be a problem. Some formulations don't have the active ingredient (parthenolide) that prevents migraine. A parthenolide content of 0.2% is generally recommended. Typical dosage is one capsule 3 times a day.  Coenzyme Q10: This is present in almost all cells in the body and is critical component for the conversion of energy. Recent studies have shown that a nutritional supplement of CoQ10 can reduce the frequency of migraine attacks by improving the energy production of cells as with riboflavin. Doses of 150 mg twice a day have been shown to be effective.  Melatonin: Increasing evidence shows correlation between melatonin secretion and headache conditions. Melatonin supplementation has decreased headache intensity and duration. It is widely used as a sleep aid. Sleep is natures way of dealing with migraine. A dose of 3 mg is recommended to start for headaches including cluster headache. Higher doses up to 15 mg has been reviewed for use in Cluster headache and have been used. The rationale behind using melatonin for cluster is that many theories regarding the cause of Cluster headache center around the disruption of the normal circadian rhythm in the brain. This helps restore the normal circadian rhythm.  Ginger: Ginger has a small amount of antihistamine and anti-inflammatory action which may help headache. It is primarily used for nausea and may aid in the absorption of other medications.  The physicians and staff at [facility] are committed to providing excellent care. You may receive a survey requesting feedback about your experience at our office. We strive to receive "very good" responses to the survey questions. If you feel that your experience would prevent you from giving the office a "very good " response, please contact our office to try to remedy the situation. We may be reached at [phone]. Thank you for  taking the time out of your busy day to complete the survey.  Jacquelyne Balint, MD [facility]

## 2023-05-16 LAB — VITAMIN B12: Vitamin B-12: 362 pg/mL (ref 200–1100)

## 2023-05-16 LAB — TSH: TSH: 1.81 m[IU]/L (ref 0.40–4.50)

## 2023-06-04 ENCOUNTER — Ambulatory Visit: Payer: 59

## 2023-06-04 DIAGNOSIS — J349 Unspecified disorder of nose and nasal sinuses: Secondary | ICD-10-CM | POA: Diagnosis not present

## 2023-06-04 DIAGNOSIS — H539 Unspecified visual disturbance: Secondary | ICD-10-CM

## 2023-06-04 DIAGNOSIS — G43109 Migraine with aura, not intractable, without status migrainosus: Secondary | ICD-10-CM | POA: Diagnosis not present

## 2023-06-04 MED ORDER — GADOBUTROL 1 MMOL/ML IV SOLN
10.0000 mL | Freq: Once | INTRAVENOUS | Status: AC | PRN
  Administered 2023-06-04: 10 mL via INTRAVENOUS

## 2023-06-14 ENCOUNTER — Ambulatory Visit: Payer: 59 | Admitting: Neurology

## 2023-09-05 NOTE — Progress Notes (Deleted)
 NEUROLOGY FOLLOW UP OFFICE NOTE  Johnathan White [id number]  Subjective:  Johnathan White is a 58 y.o. year old right-handed male with a medical history of HTN, non-alcoholic fatty liver disease, OSA (not on CPAP since 11/2022), and GERD who we last saw on 05/15/23 for headaches.  To briefly review: 05/15/23: Patient has had headaches for years, since a child. These minor and irregular. It can hurt in the neck and top/front of head. He had no photophobia, phonophobia, nausea, or vomiting. He never noticed autonomic symptoms either. He would rate the pain 2/10. The headache would resolve with advil. It may last 30 minutes. He would have 2-3 headaches per month.   He is now having headaches in different parts of his head. The pain was in the neck initially. He is noticing more pain along the right eyebrow lately. He rates the pain worse, but difficult to put a number to it. It is a much stabbing pain. The headaches are becoming more frequent. He has about 2 headaches per week now. He also has flashing, geometric flashing lights in his eyes prior to headaches starting (about once per week over the last month). He endorses photophobia and thinks this may trigger the flashing lights. He endorses phonophobia. He denies nausea or vomiting. He had some vision changes and lip tingling as well. He does not recall if he had a headache with those. They only occurred once. When he gets this headache he goes to a dark quiet room and lays down. He will take advil which helps. The headache can last hours if he takes nothing. He will take 800 mg of advil and the headache will resolve within 30 minutes.    He went to the ED on 04/19/23 due to lip numbness and vision changes. He had a CT head that was normal. Patient had tenderness in frontal sinus area - felt to be cause per ED. His HA improved with a couple of headache cocktails (but patient mentions his head did not hurt too much).   He has noticed an increase in BP  recently and chronic tinnitus as well.   He also mentions an intermittent spasm in the left index finger. It is an involuntary twitch of the index finger. He does not think he can stop it. It lasts a few seconds and occurs a few times per week. He has some joint pain, but denies numbness or tingling. He feels reduced strength in the left hand.   EtOH use: No Caffeine use: 2L of soda per day, maybe more Smoker: No Family history of neurologic disease, including headaches: No  Most recent Assessment and Plan (05/15/23): Johnathan White is a 58 y.o. male who presents for evaluation of headaches. He has a relevant medical history of HTN, non-alcoholic fatty liver disease, OSA (not on CPAP since 11/2022), and GERD. His neurological examination is essentially normal today. Available diagnostic data is significant for normal CT head. Patient's headaches sound most consistent with migraine with aura. He is currently having about 1 headache per week that responds well to ibuprofen 800 mg. Given his age and the change in headaches, MRI brain is reasonable to ensure no intracranial pathology that may have been missed on CT head. We discussed contributing factors such as soda intake, untreated OSA, neck pain, and high blood pressure. We also discussed medication, but patient would like to try non-pharmacologic options first.   PLAN: -Blood work: B12, TSH -MRI brain w/wo contrast For headaches: Migraine prevention:  Discussed medications. Patient is not sure he wants. Will give OTC recommendations Migraine rescue: Can continue advil as it seems to work well Limit use of pain relievers to no more than 2 days out of week to prevent risk of rebound or medication-overuse headache. Keep headache diary   -Physical therapy for neck pain   -BP elevated today, but okay on recheck. Patient to discuss with PCP if BP continues to be a concern. -Discussed importance of treating CPAP. Patient to start back and will consider  sleep medicine consult or discussing with PCP.  Since their last visit: ***  MEDICATIONS:  Outpatient Encounter Medications as of 09/13/2023  Medication Sig   GAVISCON EXTRA STRENGTH 160-105 MG CHEW Chew 2 tablets by mouth daily as needed (for indigestion).   Homeopathic Products (ZICAM COLD REMEDY) TBDP Take 1-2 tablets by mouth daily as needed (for cold-like symptoms).   Multiple Vitamins-Minerals (EMERGEN-C VITAMIN C) PACK Take 1 packet by mouth daily as needed (to boost the immune system- mix with water before drinking). (Patient not taking: Reported on 05/15/2023)   PRESCRIPTION MEDICATION CPAP- At bedtime   No facility-administered encounter medications on file as of 09/13/2023.    PAST MEDICAL HISTORY: Past Medical History:  Diagnosis Date   Back pain, chronic    Chronic heartburn    Hypertension    Spontaneous pneumothorax    as a child    PAST SURGICAL HISTORY: Past Surgical History:  Procedure Laterality Date   WISDOM TOOTH EXTRACTION      ALLERGIES: Allergies  Allergen Reactions   Bee Venom Hives, Swelling and Other (See Comments)    Site stung swells and develops large welts. Patient also felt "tension" in his throat.    FAMILY HISTORY: Family History  Problem Relation Age of Onset   Hypertension Father    Diabetes Maternal Aunt    Diabetes Maternal Grandmother    Sudden death Paternal Grandfather    Hyperlipidemia Neg Hx    Heart attack Neg Hx    Cancer Mother        breast   Mitral valve prolapse Mother    Cancer Sister        breast    SOCIAL HISTORY: Social History   Tobacco Use   Smoking status: Never   Smokeless tobacco: Never  Vaping Use   Vaping status: Never Used  Substance Use Topics   Alcohol use: No   Drug use: No   Social History   Social History Narrative   Are you right handed or left handed? Right   Are you currently employed ?    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 Wife and 4 children   What type of home do you live in: 1 story or 2 story? two    Caffiene a lot of soda , tea, coffee      Objective:  Vital Signs:  There were no vitals taken for this visit.  ***  Labs and Imaging review: New results: 05/15/23: B12: 362 TSH wnl  MRI brain w/wo contrast (06/04/23): IMPRESSION: 1. No evidence of an acute intracranial abnormality. 2. Minimal multifocal T2 FLAIR hyperintense signal abnormality within the cerebral white matter. These signal changes are nonspecific and differential considerations include chronic small vessel ischemic disease and sequelae of chronic migraine headaches, among others. 3. Minor paranasal sinus mucosal thickening.  Previously reviewed results: 04/19/23: CMP significant for K 3.3, glucose 124,  ALT 60 CBC unremarkable   External labs: TSH (12/01/19): wnl   CT head wo contrast (04/19/23): FINDINGS: Brain: No evidence of acute infarction, hemorrhage, hydrocephalus, extra-axial collection or mass lesion/mass effect.   Vascular: No hyperdense vessel or unexpected calcification.   Skull: Normal. Negative for fracture or focal lesion.   Sinuses/Orbits: No acute finding.   Other: None.   IMPRESSION: Normal head CT.   EKG EKG Interpretation Date/Time:                  Friday April 19 2023 18:36:29 EST Ventricular Rate:         56 PR Interval:                 180 QRS Duration:             114 QT Interval:                 413 QTC Calculation:399 R Axis:                         -7   Text Interpretation:Sinus rhythm Incomplete right bundle branch block Low voltage, precordial leads Minimal ST elevation, inferior leads No significant change since last tracing Confirmed by Jacalyn Lefevre [phone]) on 04/19/2023 6:38:37 PM   MRI lumbar spine wo contrast (06/09/2011): Findings: Normal signal is present in the conus medullaris which  terminates at L1, within normal limits.  Marrow signal, vertebral   body heights, alignment are normal.  Limited imaging of the abdomen  is unremarkable.   The disc levels at L3-4 and above are normal.   L4-5: A left paracentral disc protrusion is present at L4-5 with  severe impact on the left lateral recess.  There is mild left  foraminal narrowing.   L5-S1:  Mild disc bulging is present without significant stenosis.   IMPRESSION:   1. A left paracentral disc protrusion at L4-5 results in moderate  to severe left lateral recess narrowing, potentially impacting the  left S1 nerve root.  2.  Mild left foraminal narrowing at L4-5.  3.  No other significant disc disease.   Assessment/Plan:  This is Alberteen Spindle, a 58 y.o. male with: ***   Plan: ***  Return to clinic in ***  Total time spent reviewing records, interview, history/exam, documentation, and coordination of care on day of encounter:  *** min  Jacquelyne Balint, MD

## 2023-09-13 ENCOUNTER — Ambulatory Visit: Payer: 59 | Admitting: Neurology

## 2024-04-03 ENCOUNTER — Other Ambulatory Visit: Payer: Self-pay

## 2024-04-03 ENCOUNTER — Telehealth: Payer: Self-pay

## 2024-04-03 ENCOUNTER — Ambulatory Visit
Admission: EM | Admit: 2024-04-03 | Discharge: 2024-04-03 | Disposition: A | Discharge status: Home / Self Care | Attending: Family Medicine | Admitting: Family Medicine

## 2024-04-03 DIAGNOSIS — L739 Follicular disorder, unspecified: Secondary | ICD-10-CM

## 2024-04-03 MED ORDER — DOXYCYCLINE HYCLATE 100 MG PO CAPS
ORAL_CAPSULE | ORAL | 0 refills | Status: DC
Start: 2024-04-03 — End: 2024-04-03

## 2024-04-03 MED ORDER — DOXYCYCLINE HYCLATE 100 MG PO CAPS: ORAL_CAPSULE | ORAL | 0 refills | Status: AC

## 2024-04-03 NOTE — ED Triage Notes (Addendum)
 Has c/o rash to left axilla and down ribcage. Itches and hurts. Rash has been present 2-3 days ago. Has had itchiness under the armpit for a while. No fever. Has been using lotrimin to groin for itching.

## 2024-04-03 NOTE — Discharge Instructions (Signed)
 May apply 1% hydrocortisone cream once daily for itching if needed.

## 2024-04-03 NOTE — ED Provider Notes (Signed)
 [facility]    CSN: [id number] Arrival date & time: 04/03/24  1548      History   Chief Complaint Chief Complaint  Patient presents with   Rash    HPI Johnathan White is a 58 y.o. male.   Patient reports that he often has itching in his axillae, without rash.  About 3 days ago he noticed an increasingly pruritic/burning rash in his right axilla that extends somewhat beyond the axilla.  He denies swelling or drainage from the area and no fever.  He notes that he recently had a rash in his groin area that resolved with Lotrimin cream.  The history is provided by the patient.  Rash Location: right axilla. Quality: burning, dryness, itchiness, painful and redness   Quality: not blistering, not bruising, not draining, not peeling, not scaling, not swelling and not weeping   Pain details:    Quality:  Burning and itching   Severity:  Mild   Onset quality:  Sudden   Duration:  3 days   Timing:  Constant   Progression:  Worsening Severity:  Mild Onset quality:  Sudden Duration:  3 days Timing:  Constant Progression:  Spreading Chronicity:  New Context: not animal contact, not chemical exposure, not exposure to similar rash, not insect bite/sting, not medications, not new detergent/soap, not nuts and not plant contact   Relieved by:  Nothing Worsened by:  Nothing Ineffective treatments:  Antihistamines Associated symptoms: no fatigue, no fever and no induration     Past Medical History:  Diagnosis Date   Back pain, chronic    Chronic heartburn    Hypertension    Spontaneous pneumothorax    as a child    Patient Active Problem List   Diagnosis Date Noted   Diverticulitis 09/08/2021   Non-alcoholic fatty liver disease 09/08/2021   Low back pain 04/20/2011   Essential hypertension 06/11/2007   External hemorrhoids 06/11/2007   GASTROESOPHAGEAL REFLUX DISEASE, CHRONIC 06/11/2007    Past Surgical History:  Procedure Laterality Date   WISDOM TOOTH  EXTRACTION         Home Medications    Prior to Admission medications   Medication Sig Start Date End Date Taking? Authorizing Provider  doxycycline (VIBRAMYCIN) 100 MG capsule Take one cap PO Q12hr with food. 04/03/24  Yes Pauline Garnette LABOR, MD  GAVISCON EXTRA STRENGTH 160-105 MG CHEW Chew 2 tablets by mouth daily as needed (for indigestion).    [provider]  Homeopathic Products [facility] COLD REMEDY) TBDP Take 1-2 tablets by mouth daily as needed (for cold-like symptoms).    [provider]  Multiple Vitamins-Minerals (EMERGEN-C VITAMIN C) PACK Take 1 packet by mouth daily as needed (to boost the immune system- mix with water before drinking). Patient not taking: Reported on 05/15/2023    [provider]  PRESCRIPTION MEDICATION CPAP- At bedtime    [provider]    Family History Family History  Problem Relation Age of Onset   Hypertension Father    Diabetes Maternal Aunt    Diabetes Maternal Grandmother    Sudden death Paternal Grandfather    Hyperlipidemia Neg Hx    Heart attack Neg Hx    Cancer Mother        breast   Mitral valve prolapse Mother    Cancer Sister        breast    Social History Social History   Tobacco Use   Smoking status: Never   Smokeless tobacco: Never  Vaping Use   Vaping status: Never Used  Substance Use Topics   Alcohol use: No   Drug use: No     Allergies   Bee venom   Review of Systems Review of Systems  Constitutional:  Negative for diaphoresis, fatigue and fever.  Skin:  Positive for color change and rash.  All other systems reviewed and are negative.    Physical Exam Triage Vital Signs ED Triage Vitals  Encounter Vitals Group     BP 04/03/24 1604 (!) 158/92     Girls Systolic BP Percentile --      Girls Diastolic BP Percentile --      Boys Systolic BP Percentile --      Boys Diastolic BP Percentile --      Pulse Rate 04/03/24 1604 77     Resp 04/03/24 1604 16     Temp 04/03/24  1604 98.6 F (37 C)     Temp src --      SpO2 04/03/24 1604 95 %     Weight --      Height --      Head Circumference --      Peak Flow --      Pain Score 04/03/24 1606 0     Pain Loc --      Pain Education --      Exclude from Growth Chart --    No data found.  Updated Vital Signs BP (!) 158/92   Pulse 77   Temp 98.6 F (37 C)   Resp 16   SpO2 95%   Visual Acuity Right Eye Distance:   Left Eye Distance:   Bilateral Distance:    Right Eye Near:   Left Eye Near:    Bilateral Near:     Physical Exam Vitals and nursing note reviewed.  Constitutional:      General: He is not in acute distress.    Appearance: He is not ill-appearing.  HENT:     Head: Normocephalic.  Eyes:     Pupils: Pupils are equal, round, and reactive to light.  Cardiovascular:     Rate and Rhythm: Normal rate.  Pulmonary:     Effort: Pulmonary effort is normal.  Skin:    General: Skin is warm and dry.     Findings: Rash present.     Comments: Right axilla has irregular patches of macular erythema that extend slightly beyond the axilla.  Several tiny follicular pustules are noted.  There is no swelling, induration, fluctuance or tenderness to palpation.    Neurological:     Mental Status: He is alert.      UC Treatments / Results  Labs (all labs ordered are listed, but only abnormal results are displayed) Labs Reviewed - No data to display  EKG   Radiology No results found.  Procedures Procedures (including critical care time)  Medications Ordered in UC Medications - No data to display  Initial Impression / Assessment and Plan / UC Course  I have reviewed the triage vital signs and the nursing notes.  Pertinent labs & imaging results that were available during my care of the patient were reviewed by me and considered in my medical decision making (see chart for details).    Begin doxycycline 100mg  Q12hr. Followup with Family Doctor if not improved in one week.   Final  Clinical Impressions(s) / UC Diagnoses   Final diagnoses:  Folliculitis of axilla     Discharge Instructions  May apply 1% hydrocortisone cream once daily for itching if needed.    ED Prescriptions     Medication Sig Dispense Auth. Provider   doxycycline (VIBRAMYCIN) 100 MG capsule Take one cap PO Q12hr with food. 14 capsule Pauline Garnette LABOR, MD         Pauline Garnette LABOR, MD 04/04/24 [phone]

## 2024-04-03 NOTE — Telephone Encounter (Signed)
 Rx sent to [facility] per pt request
